# Patient Record
Sex: Female | Born: 1995 | Hispanic: Yes | Marital: Married | Smoking: Never smoker
Health system: Southern US, Community
[De-identification: ages and names within clinical notes are randomized; demographics above are authoritative.]

## PROBLEM LIST (undated history)

## (undated) ENCOUNTER — Inpatient Hospital Stay (HOSPITAL_COMMUNITY): Payer: Self-pay

## (undated) DIAGNOSIS — Z8719 Personal history of other diseases of the digestive system: Secondary | ICD-10-CM

## (undated) DIAGNOSIS — F419 Anxiety disorder, unspecified: Secondary | ICD-10-CM

## (undated) DIAGNOSIS — F32A Depression, unspecified: Secondary | ICD-10-CM

## (undated) DIAGNOSIS — Z789 Other specified health status: Secondary | ICD-10-CM

## (undated) HISTORY — PX: APPENDECTOMY: SHX54

## (undated) HISTORY — PX: CHOLECYSTECTOMY: SHX55

---

## 2020-07-23 LAB — OB RESULTS CONSOLE GC/CHLAMYDIA
Chlamydia: NEGATIVE
Gonorrhea: NEGATIVE

## 2020-07-30 ENCOUNTER — Encounter (HOSPITAL_COMMUNITY): Payer: Self-pay | Admitting: Emergency Medicine

## 2020-07-30 ENCOUNTER — Other Ambulatory Visit: Payer: Self-pay

## 2020-07-30 ENCOUNTER — Inpatient Hospital Stay (HOSPITAL_COMMUNITY): Payer: Medicaid Other

## 2020-07-30 ENCOUNTER — Inpatient Hospital Stay (HOSPITAL_COMMUNITY)
Admission: AD | Admit: 2020-07-30 | Discharge: 2020-07-30 | Disposition: A | Discharge status: Home / Self Care | Payer: Medicaid Other | Attending: Obstetrics and Gynecology | Admitting: Obstetrics and Gynecology

## 2020-07-30 DIAGNOSIS — O99891 Other specified diseases and conditions complicating pregnancy: Secondary | ICD-10-CM

## 2020-07-30 DIAGNOSIS — Z3A08 8 weeks gestation of pregnancy: Secondary | ICD-10-CM | POA: Insufficient documentation

## 2020-07-30 DIAGNOSIS — O99351 Diseases of the nervous system complicating pregnancy, first trimester: Secondary | ICD-10-CM | POA: Diagnosis not present

## 2020-07-30 DIAGNOSIS — O26891 Other specified pregnancy related conditions, first trimester: Secondary | ICD-10-CM | POA: Diagnosis present

## 2020-07-30 DIAGNOSIS — M549 Dorsalgia, unspecified: Secondary | ICD-10-CM | POA: Diagnosis not present

## 2020-07-30 DIAGNOSIS — R519 Headache, unspecified: Secondary | ICD-10-CM

## 2020-07-30 DIAGNOSIS — Z20822 Contact with and (suspected) exposure to covid-19: Secondary | ICD-10-CM | POA: Diagnosis not present

## 2020-07-30 DIAGNOSIS — O219 Vomiting of pregnancy, unspecified: Secondary | ICD-10-CM | POA: Insufficient documentation

## 2020-07-30 HISTORY — DX: Other specified health status: Z78.9

## 2020-07-30 LAB — URINALYSIS, ROUTINE W REFLEX MICROSCOPIC
Bilirubin Urine: NEGATIVE
Glucose, UA: 50 mg/dL — AB
Hgb urine dipstick: NEGATIVE
Ketones, ur: 5 mg/dL — AB
Leukocytes,Ua: NEGATIVE
Nitrite: NEGATIVE
Protein, ur: NEGATIVE mg/dL
Specific Gravity, Urine: 1.032 — ABNORMAL HIGH (ref 1.005–1.030)
pH: 5 (ref 5.0–8.0)

## 2020-07-30 LAB — POCT PREGNANCY, URINE: Preg Test, Ur: POSITIVE — AB

## 2020-07-30 LAB — SARS CORONAVIRUS 2 BY RT PCR (HOSPITAL ORDER, PERFORMED IN ~~LOC~~ HOSPITAL LAB): SARS Coronavirus 2: NEGATIVE

## 2020-07-30 MED ORDER — CYCLOBENZAPRINE HCL 5 MG PO TABS
10.0000 mg | ORAL_TABLET | Freq: Once | ORAL | Status: AC
  Administered 2020-07-30: 10 mg via ORAL
  Filled 2020-07-30: qty 2

## 2020-07-30 MED ORDER — ONDANSETRON 4 MG PO TBDP: 4.0000 mg | ORAL_TABLET | Freq: Four times a day (QID) | ORAL | 0 refills | Status: DC | PRN

## 2020-07-30 MED ORDER — ONDANSETRON 4 MG PO TBDP
4.0000 mg | ORAL_TABLET | Freq: Once | ORAL | Status: AC
  Administered 2020-07-30: 4 mg via ORAL
  Filled 2020-07-30: qty 1

## 2020-07-30 MED ORDER — CYCLOBENZAPRINE HCL 10 MG PO TABS: 10.0000 mg | ORAL_TABLET | Freq: Two times a day (BID) | ORAL | 0 refills | Status: DC | PRN

## 2020-07-30 NOTE — MAU Note (Signed)
Pt reports to MAU c/o lower back pain. Pt reports she went to the OB today and said the baby was okay but she was conserned for kidney stones. Pt reports she is having some bilateral numbness in her legs that feels like her legs are falling asleep. Pt reports HA, Vomiting, SOB.

## 2020-07-30 NOTE — ED Triage Notes (Signed)
Emergency Medicine Provider OB Triage Evaluation Note  Natasha Koch is a 24 y.o. female, G2P1, at [redacted] weeks gestation who presents to the emergency department with complaints of low back pains, nausea vomiting.  Reports low back pain, mostly on the left side for 1 week.  Denies trauma or heavy lifting.  Has had some dysuria.  Went to see her OB/GYN today who confirmed intrauterine pregnancy, noted reassuring UA but wet prep concerning for yeast infection.  Has had chronic urinary incontinence of small amounts of urine ever since her first pregnancy a couple of years ago.  Denies saddle anesthesia or fevers.  Has been taking Tylenol with little relief.  Review of  Systems  Positive: Low back pain, nausea, vomiting, dysuria Negative: Fevers  Physical Exam  BP 127/77   Pulse 94   Temp 99.5 F (37.5 C) (Oral)   Resp 18   LMP 05/30/2020   SpO2 100%  General: Awake, no distress  HEENT: Atraumatic  Resp: Normal effort  Cardiac: Normal rate Abd: Nondistended, nontender  MSK: Moves all extremities without difficulty.  Some midline lumbar spine tenderness with left paralumbar muscle tenderness noted.  5/5 strength of BLE major muscle groups Neuro: Speech clear, sensation intact to light touch of bilateral lower extremities  Medical Decision Making  Pt evaluated for pregnancy concern and is stable for transfer to MAU. Pt is in agreement with plan for transfer.  6:34 PM Discussed with MAU APP, Victorino Dike, who accepts patient in transfer.  Clinical Impression  No diagnosis found.     Jeanie Sewer, PA-C 07/30/20 1836

## 2020-07-30 NOTE — MAU Provider Note (Signed)
Chief Complaint: Back Pain   First Provider Initiated Contact with Patient 07/30/20 1946      SUBJECTIVE HPI: Natasha Koch is a 24 y.o. G2P1001 at [redacted]w[redacted]d by LMP c/w early [place] in the office who presents to maternity admissions reporting back pain, headache, nausea and vomiting x 1 week. She has pain mostly at work while sitting, and the pain radiates down into her legs, and is associated with tingling and numbness in her legs.  She has missed work x 3-4 days this week for the pain.     Location: back, head Quality: dull and pressure pain Severity: 9/10 on pain scale Duration: 1 week Timing: constant, but waxes and wanes Modifying factors: worse after sitting at work Associated signs and symptoms: nausea, vomiting, numbness/tingling in legs  HPI  Past Medical History:  Diagnosis Date  . Medical history non-contributory    Past Surgical History:  Procedure Laterality Date  . APPENDECTOMY     Social History   Socioeconomic History  . Marital status: Married    Spouse name: Not on file  . Number of children: Not on file  . Years of education: Not on file  . Highest education level: Not on file  Occupational History  . Not on file  Tobacco Use  . Smoking status: Never Smoker  . Smokeless tobacco: Never Used  Substance and Sexual Activity  . Alcohol use: Not Currently  . Drug use: Never  . Sexual activity: Not on file  Other Topics Concern  . Not on file  Social History Narrative  . Not on file   Social Determinants of Health   Financial Resource Strain:   . Difficulty of Paying Living Expenses: Not on file  Food Insecurity:   . Worried About Programme researcher, broadcasting/film/video in the Last Year: Not on file  . Ran Out of Food in the Last Year: Not on file  Transportation Needs:   . Lack of Transportation (Medical): Not on file  . Lack of Transportation (Non-Medical): Not on file  Physical Activity:   . Days of Exercise per Week: Not on file  . Minutes of Exercise per Session:  Not on file  Stress:   . Feeling of Stress : Not on file  Social Connections:   . Frequency of Communication with Friends and Family: Not on file  . Frequency of Social Gatherings with Friends and Family: Not on file  . Attends Religious Services: Not on file  . Active Member of Clubs or Organizations: Not on file  . Attends Banker Meetings: Not on file  . Marital Status: Not on file  Intimate Partner Violence:   . Fear of Current or Ex-Partner: Not on file  . Emotionally Abused: Not on file  . Physically Abused: Not on file  . Sexually Abused: Not on file   No current facility-administered medications on file prior to encounter.   Current Outpatient Medications on File Prior to Encounter  Medication Sig Dispense Refill  . Prenatal Vit-Fe Fumarate-FA (MULTIVITAMIN-PRENATAL) 27-0.8 MG TABS tablet Take 1 tablet by mouth daily at 12 noon.     No Known Allergies  ROS:  Review of Systems  Constitutional: Negative for chills, fatigue and fever.  Eyes: Negative for visual disturbance.  Respiratory: Negative for shortness of breath.   Cardiovascular: Negative for chest pain.  Gastrointestinal: Positive for nausea and vomiting. Negative for abdominal pain.  Genitourinary: Negative for difficulty urinating, dysuria, flank pain, pelvic pain, vaginal bleeding, vaginal discharge and  vaginal pain.  Musculoskeletal: Positive for back pain.  Neurological: Positive for numbness and headaches. Negative for dizziness.  Psychiatric/Behavioral: Negative.      I have reviewed patient's Past Medical Hx, Surgical Hx, Family Hx, Social Hx, medications and allergies.   Physical Exam   Patient Vitals for the past 24 hrs:  BP Temp Temp src Pulse Resp SpO2  07/30/20 1819 127/77 99.5 F (37.5 C) Oral 94 18 100 %   Constitutional: Well-developed, well-nourished female in no acute distress.  HEART: normal rate, heart sounds, regular rhythm RESP: normal effort, lung sounds clear and  equal bilaterally GI: Abd soft, non-tender. Pos BS x 4 MS: Extremities nontender, no edema, normal ROM Neurologic: Alert and oriented x 4.  GU: Neg CVAT but generalized tenderness of the low back    LAB RESULTS Results for orders placed or performed during the hospital encounter of 07/30/20 (from the past 24 hour(s))  Urinalysis, Routine w reflex microscopic Urine, Clean Catch     Status: Abnormal   Collection Time: 07/30/20  7:14 PM  Result Value Ref Range   Color, Urine YELLOW YELLOW   APPearance HAZY (A) CLEAR   Specific Gravity, Urine 1.032 (H) 1.005 - 1.030   pH 5.0 5.0 - 8.0   Glucose, UA 50 (A) NEGATIVE mg/dL   Hgb urine dipstick NEGATIVE NEGATIVE   Bilirubin Urine NEGATIVE NEGATIVE   Ketones, ur 5 (A) NEGATIVE mg/dL   Protein, ur NEGATIVE NEGATIVE mg/dL   Nitrite NEGATIVE NEGATIVE   Leukocytes,Ua NEGATIVE NEGATIVE  Pregnancy, urine POC     Status: Abnormal   Collection Time: 07/30/20  7:48 PM  Result Value Ref Range   Preg Test, Ur POSITIVE (A) NEGATIVE       IMAGING US Renal  Result Date: 07/30/2020 CLINICAL DATA:  Back pain and pregnancy. Pregnant patient in first-trimester pregnancy, [redacted] weeks gestation. EXAM: RENAL / URINARY TRACT ULTRASOUND COMPLETE COMPARISON:  None. FINDINGS: Right Kidney: Renal measurements: 11.4 x 5.1 x 4.8 cm = volume: 144 mL. Echogenicity within normal limits. No mass or hydronephrosis visualized. No evidence of echogenic shadowing calculi. Left Kidney: Renal measurements: 11.5 x 4.6 x 4.1 cm = volume: 113 mL. Echogenicity within normal limits. No mass or hydronephrosis visualized. No evidence of echogenic shadowing calculi. Bladder: Appears normal for degree of bladder distention. Other: Increased hepatic echogenicity typical of steatosis. IMPRESSION: 1. Unremarkable sonographic appearance of the kidneys and bladder. 2. Hepatic steatosis. Electronically Signed   By: Narda Rutherford M.D.   On: 07/30/2020 21:21    MAU Management/MDM: Orders  Placed This Encounter  Procedures  . SARS Coronavirus 2 by RT PCR (hospital order, performed in [facility] hospital lab) Nasopharyngeal Nasopharyngeal Swab  . US Renal  . Urinalysis, Routine w reflex microscopic Urine, Clean Catch  . Pregnancy, urine POC  . Discharge patient    Meds ordered this encounter  Medications  . ondansetron (ZOFRAN-ODT) disintegrating tablet 4 mg  . cyclobenzaprine (FLEXERIL) tablet 10 mg  . cyclobenzaprine (FLEXERIL) 10 MG tablet    Sig: Take 1 tablet (10 mg total) by mouth 2 (two) times daily as needed for muscle spasms.    Dispense:  20 tablet    Refill:  0    Order Specific Question:   Supervising Provider    Answer:   Reva Bores [[id number]]  . ondansetron (ZOFRAN ODT) 4 MG disintegrating tablet    Sig: Take 1 tablet (4 mg total) by mouth every 6 (six) hours as needed for nausea.  Dispense:  20 tablet    Refill:  0    Order Specific Question:   Supervising Provider    Answer:   Reva Bores [[id number]]    Pt with low back pain, no CVA tenderness, no hematuria but significant back pain and concerns from pt about kidney stones so renal US done.  No abnormalities on [place].  Unlikely stone with normal [place] and no hematuria.  No evidence of UTI.  Flexeril and Zofran given in MAU. Rx sent for both medications.  Letter provided for pt to miss work this week. F/U in office next week. Consider physical therapy for likely musculoskeletal pain/sciatica.   Pt discharged with strict return precautions.  ASSESSMENT 1. Back pain affecting pregnancy in first trimester   2. Headache in pregnancy, antepartum, first trimester   3. Nausea and vomiting during pregnancy prior to [redacted] weeks gestation   4. Person under investigation for COVID-19     PLAN Discharge home Allergies as of 07/30/2020   No Known Allergies     Medication List    TAKE these medications   cyclobenzaprine 10 MG tablet Commonly known as: FLEXERIL Take 1 tablet (10 mg total) by mouth 2 (two) times daily  as needed for muscle spasms.   multivitamin-prenatal 27-0.8 MG Tabs tablet Take 1 tablet by mouth daily at 12 noon.   ondansetron 4 MG disintegrating tablet Commonly known as: Zofran ODT Take 1 tablet (4 mg total) by mouth every 6 (six) hours as needed for nausea.       Follow-up Information    Ob/Gyn, Nestor Ramp Follow up in 1 week(s).   Contact information: [address] [place] [address] [place] [address] [phone]               Sharen Counter Certified Nurse-Midwife 07/30/2020  9:38 PM

## 2020-07-30 NOTE — ED Notes (Signed)
Transport called, report given to MAU

## 2020-07-30 NOTE — Discharge Instructions (Signed)
To look for COVID vaccine administration in your area:  KeywordPortfolios.com.br  Pregnant and recently pregnant people are more likely to get severely ill with COVID-19 compared with non-pregnant people.  If you are pregnant, you can receive a COVID-19 vaccine.  Please go to the CDC's website to read more about it and make your decision about vaccination: TVMyth.nl.html

## 2020-07-30 NOTE — ED Triage Notes (Signed)
Pt c/o back pain x 1 week, +n/v, pt seen by OB today 8 weeks preg, given meds for yeast infection otherwise neg u/a, denies vag bleeding. Pt sent to ED for further workup

## 2020-07-30 NOTE — MAU Note (Signed)
Sent up from ER for further eval.  Early preg.  Low back pain.  No bleeding. Recent dx of yeast infection.  C/o vomiting.

## 2020-08-06 LAB — OB RESULTS CONSOLE RUBELLA ANTIBODY, IGM: Rubella: IMMUNE

## 2020-08-06 LAB — OB RESULTS CONSOLE HEPATITIS B SURFACE ANTIGEN: Hepatitis B Surface Ag: NEGATIVE

## 2020-08-06 LAB — OB RESULTS CONSOLE HIV ANTIBODY (ROUTINE TESTING): HIV: NONREACTIVE

## 2020-08-24 ENCOUNTER — Ambulatory Visit: Payer: Medicaid Other | Attending: Family Medicine | Admitting: Physical Therapy

## 2020-08-31 ENCOUNTER — Other Ambulatory Visit: Payer: Self-pay | Admitting: Obstetrics and Gynecology

## 2020-09-01 ENCOUNTER — Ambulatory Visit (HOSPITAL_COMMUNITY)
Admission: RE | Admit: 2020-09-01 | Discharge: 2020-09-01 | Disposition: A | Discharge status: Home / Self Care | Payer: Medicaid Other | Source: Ambulatory Visit | Attending: Pulmonary Disease | Admitting: Pulmonary Disease

## 2020-09-01 ENCOUNTER — Other Ambulatory Visit: Payer: Self-pay | Admitting: Family

## 2020-09-01 ENCOUNTER — Other Ambulatory Visit (HOSPITAL_COMMUNITY): Payer: Self-pay | Admitting: Oncology

## 2020-09-01 DIAGNOSIS — U071 COVID-19: Secondary | ICD-10-CM | POA: Insufficient documentation

## 2020-09-01 MED ORDER — SODIUM CHLORIDE 0.9 % IV SOLN
Freq: Once | INTRAVENOUS | Status: AC
  Filled 2020-09-01: qty 20

## 2020-09-01 MED ORDER — DIPHENHYDRAMINE HCL 50 MG/ML IJ SOLN: 50.0000 mg | Freq: Once | INTRAMUSCULAR | Status: DC | PRN

## 2020-09-01 MED ORDER — SODIUM CHLORIDE 0.9 % IV BOLUS
1000.0000 mL | Freq: Once | INTRAVENOUS | Status: AC
  Administered 2020-09-01: 1000 mL via INTRAVENOUS

## 2020-09-01 MED ORDER — FAMOTIDINE IN NACL 20-0.9 MG/50ML-% IV SOLN: 20.0000 mg | Freq: Once | INTRAVENOUS | Status: DC | PRN

## 2020-09-01 MED ORDER — SODIUM CHLORIDE 0.9 % IV SOLN: INTRAVENOUS | Status: DC | PRN

## 2020-09-01 MED ORDER — METHYLPREDNISOLONE SODIUM SUCC 125 MG IJ SOLR: 125.0000 mg | Freq: Once | INTRAMUSCULAR | Status: DC | PRN

## 2020-09-01 MED ORDER — EPINEPHRINE 0.3 MG/0.3ML IJ SOAJ: 0.3000 mg | Freq: Once | INTRAMUSCULAR | Status: DC | PRN

## 2020-09-01 MED ORDER — ALBUTEROL SULFATE HFA 108 (90 BASE) MCG/ACT IN AERS: 2.0000 | INHALATION_SPRAY | Freq: Once | RESPIRATORY_TRACT | Status: DC | PRN

## 2020-09-01 NOTE — Progress Notes (Signed)
  Diagnosis: COVID-19  Physician: Dr. Wright  Procedure: [facility] Infusion [facility] Med: bamlanivimab\etesevimab infusion - Provided patient with bamlanimivab\etesevimab fact sheet for patients, parents and caregivers prior to infusion.  Complications: No immediate complications noted.  Discharge: Discharged home   Natasha Koch J Breccan Galant 09/01/2020  

## 2020-09-01 NOTE — Progress Notes (Signed)
I connected by phone with  Natasha Koch to discuss the potential use of an new treatment for mild to moderate COVID-19 viral infection in non-hospitalized patients.   This patient is a age/sex that meets the FDA criteria for Emergency Use Authorization of casirivimab\imdevimab.  Has a (+) direct SARS-CoV-2 viral test result 1. Has mild or moderate COVID-19  2. Is ? 22 years of age and weighs ? 40 kg 3. Is NOT hospitalized due to COVID-19 4. Is NOT requiring oxygen therapy or requiring an increase in baseline oxygen flow rate due to COVID-19 5. Is within 10 days of symptom onset 6. Has at least one of the high risk factor(s) for progression to severe COVID-19 and/or hospitalization as defined in EUA. Specific high risk criteria : Past Medical History:  Diagnosis Date  . Medical history non-contributory   ?  ? Pregnancy   Symptom onset  08/25/2020   I have spoken and communicated the following to the patient or parent/caregiver:   1. FDA has authorized the emergency use of casirivimab\imdevimab for the treatment of mild to moderate COVID-19 in adults and pediatric patients with positive results of direct SARS-CoV-2 viral testing who are 36 years of age and older weighing at least 40 kg, and who are at high risk for progressing to severe COVID-19 and/or hospitalization.   2. The significant known and potential risks and benefits of casirivimab\imdevimab, and the extent to which such potential risks and benefits are unknown.   3. Information on available alternative treatments and the risks and benefits of those alternatives, including clinical trials.   4. Patients treated with casirivimab\imdevimab should continue to self-isolate and use infection control measures (e.g., wear mask, isolate, social distance, avoid sharing personal items, clean and disinfect "high touch" surfaces, and frequent handwashing) according to CDC guidelines.    5. The patient or parent/caregiver has the option to  accept or refuse casirivimab\imdevimab .   After reviewing this information with the patient, The patient agreed to proceed with receiving casirivimab\imdevimab infusion and will be provided a copy of the Fact sheet prior to receiving the infusion.Mignon Pine, AGNP-C [phone] (Infusion Center Hotline)

## 2020-09-01 NOTE — Discharge Instructions (Signed)

## 2020-10-19 ENCOUNTER — Other Ambulatory Visit: Payer: Self-pay

## 2020-10-19 ENCOUNTER — Telehealth: Payer: Self-pay | Admitting: Physical Therapy

## 2020-10-19 ENCOUNTER — Encounter: Payer: Self-pay | Admitting: Physical Therapy

## 2020-10-19 ENCOUNTER — Ambulatory Visit: Payer: Medicaid Other | Admitting: Physical Therapy

## 2020-10-19 ENCOUNTER — Ambulatory Visit: Payer: Medicaid Other | Attending: Family Medicine

## 2020-10-19 DIAGNOSIS — M545 Low back pain, unspecified: Secondary | ICD-10-CM

## 2020-10-19 NOTE — Therapy (Signed)
[facility] Outpatient Rehabilitation [facility] [address] [place], [place], [address] Phone: [phone]   Fax:  [phone]  Physical Therapy Evaluation  Patient Details  Name: Natasha Koch MRN: [id number] Date of Birth: 03/22/1996 No data recorded  Encounter Date: 10/19/2020   PT End of Session - 10/19/20 1750    Visit Number 1    PT Start Time 1715    PT Stop Time 1730    PT Time Calculation (min) 15 min           Past Medical History:  Diagnosis Date  . Medical history non-contributory     Past Surgical History:  Procedure Laterality Date  . APPENDECTOMY      There were no vitals filed for this visit.    Subjective Assessment - 10/19/20 1720    Subjective Pt. is a 24 y/o female referred to physical therapy for LBP associated with pregnancy-currently [redacted] weeks pregnant (referral date 08/04/20). In screening questions for evaluation pt. reported she has recently been having issues with urinary incontinence for the past several weeks including multiple episodes x2-3 per week of spontaneous incontinence and symptoms for this have been worsening. Concern for cauda equina given these symptoms so patient was recommended to go to ED from clinic for emergent assessment. Pending testing results at ED and further status would plan to rescedule PT eval if results negative and cleared to proceed with therapy.    Limitations --    How long can you sit comfortably? --    How long can you stand comfortably? --                          Objective measurements completed on examination: See above findings.               PT Education - 10/19/20 1749    Education Details Go to ED from clinic for assessment of urinary incontinence symptoms accompanying LBP    Person(s) Educated Patient    Methods Explanation    Comprehension Verbalized understanding                        Patient will benefit from skilled therapeutic  intervention in order to improve the following deficits and impairments:     Visit Diagnosis: Low back pain, unspecified back pain laterality, unspecified chronicity, unspecified whether sciatica present     Problem List There are no problems to display for this patient.   Lazarus Gowda, PT, DPT 10/19/20 5:59 PM  [facility] [address] [place], [place], [address] Phone: [phone]   Fax:  [phone]  Name: Natasha Koch MRN: [id number] Date of Birth: July 26, 1996

## 2020-10-19 NOTE — Therapy (Signed)
[facility] Outpatient Rehabilitation [facility] [address] [place], [place], [address] Phone: [phone]   Fax:  [phone]  Physical Therapy Evaluation  Patient Details  Name: Natasha Koch MRN: [id number] Date of Birth: May 06, 1996 No data recorded  Encounter Date: 10/19/2020   PT End of Session - 10/19/20 1750    Visit Number 1           Past Medical History:  Diagnosis Date  . Medical history non-contributory     Past Surgical History:  Procedure Laterality Date  . APPENDECTOMY      There were no vitals filed for this visit.    Subjective Assessment - 10/19/20 1720    Subjective Pt. is a 24 y/o female referred to physical therapy for LBP associated with pregnancy-currently [redacted] weeks pregnant (referral date 08/04/20). In screening questions for evaluation pt. reported she has recently been having issues with urinary incontinence for the past several weeks including multiple episodes x2-3 per week of spontaneous incontinence and symptoms for this have been worsening. Concern for cauda equina given these symptoms so patient was recommended to go to ED from clinic for emergent assessment. Pending testing results at ED and further status would plan to rescedule PT eval if results negative and cleared to proceed with therapy.    Limitations --    How long can you sit comfortably? --    How long can you stand comfortably? --                          Objective measurements completed on examination: See above findings.               PT Education - 10/19/20 1749    Education Details Go to ED from clinic for assessment of urinary incontinence symptoms accompanying LBP    Person(s) Educated Patient    Methods Explanation    Comprehension Verbalized understanding                        Patient will benefit from skilled therapeutic intervention in order to improve the following deficits and impairments:     Visit  Diagnosis: Low back pain, unspecified back pain laterality, unspecified chronicity, unspecified whether sciatica present     Problem List There are no problems to display for this patient.   Lazarus Gowda, PT, DPT 10/19/20 5:57 PM  [facility] Outpatient Rehabilitation [facility] [address] [place], [place], [address] Phone: [phone]   Fax:  [phone]  Name: CATHARINA PICA MRN: [id number] Date of Birth: 1996/07/16

## 2020-10-19 NOTE — Telephone Encounter (Signed)
Attempted to call Ellender Hose, NP at [facility] to inform of patient status from visit today with patient recommended to go to ED for symptoms concerning for cauda equina. Provider not present at location at the time.

## 2020-10-21 ENCOUNTER — Telehealth: Payer: Self-pay | Admitting: Physical Therapy

## 2020-10-21 NOTE — Telephone Encounter (Signed)
Called and spoke with patient after attempted therapy eval (called patient 10/19/20/day of therapy visit) to see if she had gone to ED as recommended regarding bladder incontinence symptoms. She reported she had not yet gone as she had to pick up her son. Advised again to go to ED regarding symptoms for further assessment.

## 2020-10-21 NOTE — Telephone Encounter (Signed)
Attempted to call Ellender Hose, NP to inform regarding symptoms reported by patient at therapy evaluation visit 10/19/20. Left message to return call.

## 2020-10-26 ENCOUNTER — Telehealth: Payer: Self-pay | Admitting: Physical Therapy

## 2020-10-26 NOTE — Telephone Encounter (Signed)
Called [facility] to contact Ellender Hose, NP regarding status at attempted PT eval 10/19/20. Unable to speak with provider so left message to return call.

## 2020-11-20 NOTE — L&D Delivery Note (Signed)
Delivery Note At 6:24 AM a viable and healthy female was delivered via Vaginal, Spontaneous (Presentation:  Right occiput anterior    ).  APGAR: 8, 9; weight pending .   Placenta status: spontaneous , intact  .  Cord: 3 vessels   Patient pushed for approximately 22 minutes without an epidural and delivered a vigorous female infant in the vertex ROA presentation over an intact perineum with Apgar scores of 8 at 1 minute and 9 at 5 minutes.  Following delivery the infant was passed to the maternal abdomen.  After 1 minute delay the cord was clamped and cut.  The placenta delivered spontaneously, intact, with three-vessel cord.  Bilateral labial tears were noted, however they were hemostatic and did not require repair.  EBL approximately 77 cc.  All sponge, needle, instrument counts were correct.  Mom and baby are both doing well after delivery.  Anesthesia: None Episiotomy:  None Lacerations:  Bilateral labial tears, hemostatic, not requiring repair Suture Repair: NA Est. Blood Loss (mL):  77cc  Mom to postpartum.  Baby to Couplet care / Skin to Skin.  Waynard Reeds 03/09/2021, 6:35 AM

## 2021-02-11 LAB — OB RESULTS CONSOLE GBS: GBS: POSITIVE

## 2021-03-08 ENCOUNTER — Encounter (HOSPITAL_COMMUNITY): Payer: Self-pay | Admitting: Obstetrics and Gynecology

## 2021-03-08 ENCOUNTER — Inpatient Hospital Stay (HOSPITAL_COMMUNITY)
Admission: AD | Admit: 2021-03-08 | Discharge: 2021-03-10 | DRG: 807 | Disposition: A | Discharge status: Home / Self Care | Payer: Medicaid Other | Attending: Obstetrics and Gynecology | Admitting: Obstetrics and Gynecology

## 2021-03-08 ENCOUNTER — Other Ambulatory Visit: Payer: Self-pay

## 2021-03-08 DIAGNOSIS — Z8616 Personal history of COVID-19: Secondary | ICD-10-CM

## 2021-03-08 DIAGNOSIS — Z3A40 40 weeks gestation of pregnancy: Secondary | ICD-10-CM | POA: Diagnosis not present

## 2021-03-08 DIAGNOSIS — O26893 Other specified pregnancy related conditions, third trimester: Secondary | ICD-10-CM | POA: Diagnosis present

## 2021-03-08 DIAGNOSIS — O99824 Streptococcus B carrier state complicating childbirth: Secondary | ICD-10-CM | POA: Diagnosis present

## 2021-03-08 DIAGNOSIS — Z20822 Contact with and (suspected) exposure to covid-19: Secondary | ICD-10-CM | POA: Diagnosis present

## 2021-03-08 LAB — CBC
HCT: 36.3 % (ref 36.0–46.0)
Hemoglobin: 12.3 g/dL (ref 12.0–15.0)
MCH: 28.5 pg (ref 26.0–34.0)
MCHC: 33.9 g/dL (ref 30.0–36.0)
MCV: 84.2 fL (ref 80.0–100.0)
Platelets: 285 10*3/uL (ref 150–400)
RBC: 4.31 MIL/uL (ref 3.87–5.11)
RDW: 14.6 % (ref 11.5–15.5)
WBC: 14 10*3/uL — ABNORMAL HIGH (ref 4.0–10.5)
nRBC: 0 % (ref 0.0–0.2)

## 2021-03-08 LAB — TYPE AND SCREEN
ABO/RH(D): B POS
Antibody Screen: NEGATIVE

## 2021-03-08 MED ORDER — FENTANYL CITRATE (PF) 100 MCG/2ML IJ SOLN
50.0000 ug | INTRAMUSCULAR | Status: DC | PRN
  Administered 2021-03-08: 50 ug via INTRAVENOUS
  Administered 2021-03-09 (×5): 100 ug via INTRAVENOUS
  Filled 2021-03-08 (×6): qty 2

## 2021-03-08 MED ORDER — ONDANSETRON HCL 4 MG/2ML IJ SOLN: 4.0000 mg | Freq: Four times a day (QID) | INTRAMUSCULAR | Status: DC | PRN

## 2021-03-08 MED ORDER — OXYTOCIN-SODIUM CHLORIDE 30-0.9 UT/500ML-% IV SOLN
2.5000 [IU]/h | INTRAVENOUS | Status: DC
  Filled 2021-03-08: qty 500

## 2021-03-08 MED ORDER — OXYTOCIN BOLUS FROM INFUSION
333.0000 mL | Freq: Once | INTRAVENOUS | Status: AC
  Administered 2021-03-09: 333 mL via INTRAVENOUS

## 2021-03-08 MED ORDER — ACETAMINOPHEN 325 MG PO TABS: 650.0000 mg | ORAL_TABLET | ORAL | Status: DC | PRN

## 2021-03-08 MED ORDER — OXYCODONE-ACETAMINOPHEN 5-325 MG PO TABS: 2.0000 | ORAL_TABLET | ORAL | Status: DC | PRN

## 2021-03-08 MED ORDER — LACTATED RINGERS IV SOLN: 500.0000 mL | INTRAVENOUS | Status: DC | PRN

## 2021-03-08 MED ORDER — LIDOCAINE HCL (PF) 1 % IJ SOLN: 30.0000 mL | INTRAMUSCULAR | Status: DC | PRN

## 2021-03-08 MED ORDER — LACTATED RINGERS IV SOLN: INTRAVENOUS | Status: DC

## 2021-03-08 MED ORDER — PENICILLIN G POT IN DEXTROSE 60000 UNIT/ML IV SOLN
3.0000 10*6.[IU] | INTRAVENOUS | Status: DC
  Administered 2021-03-09: 3 10*6.[IU] via INTRAVENOUS
  Filled 2021-03-08: qty 50

## 2021-03-08 MED ORDER — SOD CITRATE-CITRIC ACID 500-334 MG/5ML PO SOLN: 30.0000 mL | ORAL | Status: DC | PRN

## 2021-03-08 MED ORDER — OXYCODONE-ACETAMINOPHEN 5-325 MG PO TABS: 1.0000 | ORAL_TABLET | ORAL | Status: DC | PRN

## 2021-03-08 MED ORDER — SODIUM CHLORIDE 0.9 % IV SOLN
5.0000 10*6.[IU] | Freq: Once | INTRAVENOUS | Status: AC
  Administered 2021-03-09: 5 10*6.[IU] via INTRAVENOUS
  Filled 2021-03-08: qty 5

## 2021-03-08 MED ORDER — FLEET ENEMA 7-19 GM/118ML RE ENEM: 1.0000 | ENEMA | RECTAL | Status: DC | PRN

## 2021-03-08 NOTE — MAU Note (Signed)
PT SAYS HAS AN APPOINTMENT ON Thursday - TO DISCUSS  INDUCTION, UC STRONG SINCE 8PM.  PNC WITH  [facility] - VE 2-3 CM ON LAST WED.  DENIES HSV  GBS- POSITIVE

## 2021-03-09 ENCOUNTER — Encounter (HOSPITAL_COMMUNITY): Payer: Self-pay | Admitting: Obstetrics and Gynecology

## 2021-03-09 LAB — RESP PANEL BY RT-PCR (FLU A&B, COVID) ARPGX2
Influenza A by PCR: NEGATIVE
Influenza B by PCR: NEGATIVE
SARS Coronavirus 2 by RT PCR: NEGATIVE

## 2021-03-09 LAB — RPR: RPR Ser Ql: NONREACTIVE

## 2021-03-09 MED ORDER — PRENATAL MULTIVITAMIN CH
1.0000 | ORAL_TABLET | Freq: Every day | ORAL | Status: DC
  Administered 2021-03-09 – 2021-03-10 (×2): 1 via ORAL
  Filled 2021-03-09 (×2): qty 1

## 2021-03-09 MED ORDER — DIPHENHYDRAMINE HCL 50 MG/ML IJ SOLN: 50.0000 mg | Freq: Once | INTRAMUSCULAR | Status: DC | PRN

## 2021-03-09 MED ORDER — TETANUS-DIPHTH-ACELL PERTUSSIS 5-2.5-18.5 LF-MCG/0.5 IM SUSY: 0.5000 mL | PREFILLED_SYRINGE | Freq: Once | INTRAMUSCULAR | Status: DC

## 2021-03-09 MED ORDER — OXYCODONE HCL 5 MG PO TABS
5.0000 mg | ORAL_TABLET | ORAL | Status: DC | PRN
Start: 2021-03-09 — End: 2021-03-10

## 2021-03-09 MED ORDER — FAMOTIDINE IN NACL 20-0.9 MG/50ML-% IV SOLN: 20.0000 mg | Freq: Once | INTRAVENOUS | Status: DC | PRN

## 2021-03-09 MED ORDER — SENNOSIDES-DOCUSATE SODIUM 8.6-50 MG PO TABS
2.0000 | ORAL_TABLET | Freq: Every day | ORAL | Status: DC
  Administered 2021-03-10: 2 via ORAL
  Filled 2021-03-09: qty 2

## 2021-03-09 MED ORDER — BENZOCAINE-MENTHOL 20-0.5 % EX AERO
1.0000 "application " | INHALATION_SPRAY | CUTANEOUS | Status: DC | PRN
  Administered 2021-03-09: 1 via TOPICAL
  Filled 2021-03-09: qty 56

## 2021-03-09 MED ORDER — SODIUM CHLORIDE 0.9 % IV SOLN: INTRAVENOUS | Status: DC | PRN

## 2021-03-09 MED ORDER — COCONUT OIL OIL: 1.0000 "application " | TOPICAL_OIL | Status: DC | PRN

## 2021-03-09 MED ORDER — METHYLERGONOVINE MALEATE 0.2 MG PO TABS: 0.2000 mg | ORAL_TABLET | ORAL | Status: DC | PRN

## 2021-03-09 MED ORDER — IBUPROFEN 600 MG PO TABS
600.0000 mg | ORAL_TABLET | Freq: Four times a day (QID) | ORAL | Status: DC
  Administered 2021-03-09 – 2021-03-10 (×4): 600 mg via ORAL
  Filled 2021-03-09 (×4): qty 1

## 2021-03-09 MED ORDER — DIBUCAINE (PERIANAL) 1 % EX OINT: 1.0000 "application " | TOPICAL_OINTMENT | CUTANEOUS | Status: DC | PRN

## 2021-03-09 MED ORDER — ACETAMINOPHEN 325 MG PO TABS: 650.0000 mg | ORAL_TABLET | ORAL | Status: DC | PRN

## 2021-03-09 MED ORDER — MENTHOL 3 MG MT LOZG
1.0000 | LOZENGE | OROMUCOSAL | Status: DC | PRN
  Administered 2021-03-09: 3 mg via ORAL
  Filled 2021-03-09: qty 9

## 2021-03-09 MED ORDER — METHYLERGONOVINE MALEATE 0.2 MG/ML IJ SOLN: 0.2000 mg | INTRAMUSCULAR | Status: DC | PRN

## 2021-03-09 MED ORDER — OXYCODONE HCL 5 MG PO TABS: 10.0000 mg | ORAL_TABLET | ORAL | Status: DC | PRN

## 2021-03-09 MED ORDER — WITCH HAZEL-GLYCERIN EX PADS: 1.0000 "application " | MEDICATED_PAD | CUTANEOUS | Status: DC | PRN

## 2021-03-09 MED ORDER — METHYLPREDNISOLONE SODIUM SUCC 125 MG IJ SOLR: 125.0000 mg | Freq: Once | INTRAMUSCULAR | Status: DC | PRN

## 2021-03-09 MED ORDER — ALBUTEROL SULFATE HFA 108 (90 BASE) MCG/ACT IN AERS
2.0000 | INHALATION_SPRAY | Freq: Once | RESPIRATORY_TRACT | Status: DC | PRN
Start: 2021-03-09 — End: 2021-03-09

## 2021-03-09 MED ORDER — SIMETHICONE 80 MG PO CHEW: 80.0000 mg | CHEWABLE_TABLET | ORAL | Status: DC | PRN

## 2021-03-09 MED ORDER — LACTATED RINGERS IV SOLN: INTRAVENOUS | Status: DC

## 2021-03-09 MED ORDER — ONDANSETRON HCL 4 MG/2ML IJ SOLN: 4.0000 mg | INTRAMUSCULAR | Status: DC | PRN

## 2021-03-09 MED ORDER — ONDANSETRON HCL 4 MG PO TABS: 4.0000 mg | ORAL_TABLET | ORAL | Status: DC | PRN

## 2021-03-09 MED ORDER — ZOLPIDEM TARTRATE 5 MG PO TABS: 5.0000 mg | ORAL_TABLET | Freq: Every evening | ORAL | Status: DC | PRN

## 2021-03-09 MED ORDER — DIPHENHYDRAMINE HCL 25 MG PO CAPS: 25.0000 mg | ORAL_CAPSULE | Freq: Four times a day (QID) | ORAL | Status: DC | PRN

## 2021-03-09 MED ORDER — EPINEPHRINE 0.3 MG/0.3ML IJ SOAJ: 0.3000 mg | Freq: Once | INTRAMUSCULAR | Status: DC | PRN

## 2021-03-09 MED ORDER — SODIUM CHLORIDE 0.9 % IV SOLN: Freq: Once | INTRAVENOUS | Status: DC

## 2021-03-09 NOTE — Progress Notes (Signed)
Post Partum Day 0 Subjective: Patient just transferred from L&D She is doing well without complaints. Not yet ambulating. Lochia appropriate  Objective: Patient Vitals for the past 24 hrs:  BP Temp Temp src Pulse Resp SpO2 Height Weight  03/09/21 0905 (!) 114/59 98.1 F (36.7 C) Oral 86 20 -- -- --  03/09/21 0815 -- -- -- -- -- 98 % -- --  03/09/21 0805 118/73 97.9 F (36.6 C) Oral -- 20 -- -- --  03/09/21 0732 (!) 118/58 -- -- 80 18 -- -- --  03/09/21 0716 (!) 117/59 -- -- 85 16 -- -- --  03/09/21 0700 117/70 98.3 F (36.8 C) -- 85 17 -- -- --  03/09/21 0645 (!) 110/45 -- -- 95 16 -- -- --  03/09/21 0630 (!) 114/58 -- -- (!) 105 17 -- -- --  03/09/21 0532 134/73 -- -- 86 -- -- -- --  03/09/21 0312 131/77 97.6 F (36.4 C) -- (!) 102 17 -- -- --  03/09/21 0203 132/81 -- -- 89 16 -- -- --  03/09/21 0100 127/81 -- -- 80 17 -- -- --  03/08/21 2329 130/77 98.6 F (37 C) Oral 84 17 -- 4\' 11"  (1.499 m) 82.6 kg  03/08/21 2215 122/78 98.2 F (36.8 C) Oral 90 20 -- 4\' 11"  (1.499 m) 82.6 kg    Physical Exam:  General: alert, cooperative and no distress Lochia: appropriate Uterine Fundus: firm DVT Evaluation: No evidence of DVT seen on physical exam.  Recent Labs    03/08/21 2254  WBC 14.0*  HGB 12.3  HCT 36.3  PLT 285    No results for input(s): NA, K, CL, CO2CT, BUN, CREATININE, GLUCOSE, BILITOT, ALT, AST, ALKPHOS, PROT, ALBUMIN in the last 72 hours.  No results for input(s): CALCIUM, MG, PHOS in the last 72 hours.  No results for input(s): PROTIME, APTT, INR in the last 72 hours.  No results for input(s): PROTIME, APTT, INR, FIBRINOGEN in the last 72 hours. Assessment/Plan: NZINGA FERRAN 24 y.o. 03/10/21 PPD#0 sp SVD 1. PPC: continue routine postpartum care 2. Rh pos, rubella immune. S/p tdap given prenatally 3. Dispo: likely d/c home tomorrow   LOS: 1 day   Abigail Miyamoto 03/09/2021, 10:18 AM

## 2021-03-09 NOTE — Lactation Note (Signed)
This note was copied from a baby's chart. Lactation Consultation Note  Patient Name: Natasha Koch Today's Date: 03/09/2021 Reason for consult: L&D Initial assessment Age:23 hours   P2, Baby cueing upon entering.  Assisted with latching on R breast with ease. Intermittent swallows observed.  Lactation to follow up on MBU.  Maternal Data Does the patient have breastfeeding experience prior to this delivery?: Yes How long did the patient breastfeed?: 1 month  Feeding Mother's Current Feeding Choice: Breast Milk  LATCH Score Latch: Grasps breast easily, tongue down, lips flanged, rhythmical sucking.  Audible Swallowing: A few with stimulation  Type of Nipple: Everted at rest and after stimulation  Comfort (Breast/Nipple): Soft / non-tender  Hold (Positioning): Assistance needed to correctly position infant at breast and maintain latch.  LATCH Score: 8   Lactation Tools Discussed/Used    Interventions Interventions: Assisted with latch;Skin to skin;Education  Discharge    Consult Status Consult Status: Follow-up Date: 03/10/21 Follow-up type: In-patient    Dahlia Byes [facility] 03/09/2021, 7:11 AM

## 2021-03-09 NOTE — H&P (Signed)
Natasha Koch is a 25 y.o. female presenting for labor  25 year old G2 P1-0-0-1 at 40+2 weeks presents for painful contractions and was found to be in labor.  Her pregnancy was complicated by a false positive RPR at her 1 hour sugar test.  Her titer was 1-1 and her TPA was negative.  She also had a COVID-19 infection for which she received monoclonal antibodies. OB History    Gravida  2   Para  1   Term  1   Preterm      AB      Living  1     SAB      IAB      Ectopic      Multiple      Live Births  1          Past Medical History:  Diagnosis Date  . Medical history non-contributory    Past Surgical History:  Procedure Laterality Date  . CHOLECYSTECTOMY     Family History: family history includes Diabetes in her father, maternal grandfather, maternal grandmother, paternal grandfather, and paternal grandmother. Social History:  reports that she has never smoked. She has never used smokeless tobacco. She reports previous alcohol use. She reports that she does not use drugs.     Maternal Diabetes: No Genetic Screening: Normal Maternal Ultrasounds/Referrals: Normal Fetal Ultrasounds or other Referrals:  None Maternal Substance Abuse:  No Significant Maternal Medications:  None Significant Maternal Lab Results:  Group B Strep positive  Other Comments:  28 weeks RPR positive, titer 1: 1, tPA negative  Review of Systems History Dilation: 7.5 Effacement (%): 90 Station: 0 Exam by:: Carlyn Reichert RN Blood pressure 131/77, pulse (!) 102, temperature 97.6 F (36.4 C), resp. rate 17, height 4\' 11"  (1.499 m), weight 82.6 kg, last menstrual period 05/30/2020. Exam Physical Exam  Prenatal labs: ABO, Rh: --/--/B POS (04/19 2254) Antibody: NEG (04/19 2254) Rubella: Immune (09/17 0000) RPR:   28 weeks RPR positive, titer 1: 1, tPA negative HBsAg: Negative (09/17 0000)  HIV: Non-reactive (09/17 0000)  GBS: Positive/-- (03/25 0000)   Results for orders placed or  performed during the hospital encounter of 03/08/21 (from the past 24 hour(s))  Resp Panel by RT-PCR (Flu A&B, Covid) Nasopharyngeal Swab     Status: None   Collection Time: 03/08/21 10:54 PM   Specimen: Nasopharyngeal Swab; Nasopharyngeal(NP) swabs in vial transport medium  Result Value Ref Range   SARS Coronavirus 2 by RT PCR NEGATIVE NEGATIVE   Influenza A by PCR NEGATIVE NEGATIVE   Influenza B by PCR NEGATIVE NEGATIVE  CBC     Status: Abnormal   Collection Time: 03/08/21 10:54 PM  Result Value Ref Range   WBC 14.0 (H) 4.0 - 10.5 K/uL   RBC 4.31 3.87 - 5.11 MIL/uL   Hemoglobin 12.3 12.0 - 15.0 g/dL   HCT 03/10/21 62.8 - 36.6 %   MCV 84.2 80.0 - 100.0 fL   MCH 28.5 26.0 - 34.0 pg   MCHC 33.9 30.0 - 36.0 g/dL   RDW 29.4 76.5 - 46.5 %   Platelets 285 150 - 400 K/uL   nRBC 0.0 0.0 - 0.2 %  Type and screen [facility]     Status: None   Collection Time: 03/08/21 10:54 PM  Result Value Ref Range   ABO/RH(D) B POS    Antibody Screen NEG    Sample Expiration      03/11/2021,2359 Performed at [facility], [address] [place], [place] [address]      Assessment/Plan: 1) admit 2) penicillin for group B strep 3) epidural on request   Waynard Reeds 03/09/2021, 4:01 AM

## 2021-03-10 LAB — CBC
HCT: 31.9 % — ABNORMAL LOW (ref 36.0–46.0)
Hemoglobin: 10.8 g/dL — ABNORMAL LOW (ref 12.0–15.0)
MCH: 28.7 pg (ref 26.0–34.0)
MCHC: 33.9 g/dL (ref 30.0–36.0)
MCV: 84.8 fL (ref 80.0–100.0)
Platelets: 259 10*3/uL (ref 150–400)
RBC: 3.76 MIL/uL — ABNORMAL LOW (ref 3.87–5.11)
RDW: 15 % (ref 11.5–15.5)
WBC: 11.4 10*3/uL — ABNORMAL HIGH (ref 4.0–10.5)
nRBC: 0 % (ref 0.0–0.2)

## 2021-03-10 LAB — BIRTH TISSUE RECOVERY COLLECTION (PLACENTA DONATION)

## 2021-03-10 MED ORDER — IBUPROFEN 600 MG PO TABS: 600.0000 mg | ORAL_TABLET | Freq: Four times a day (QID) | ORAL | 0 refills | Status: DC | PRN

## 2021-03-10 NOTE — Discharge Summary (Signed)
Postpartum Discharge Summary        Patient Name: Natasha Koch DOB: 08/31/1996 MRN: [id number]  Date of admission: 03/08/2021 Delivery date:03/09/2021  Delivering provider: Waynard Reeds  Date of discharge: 03/10/2021  Admitting diagnosis: Normal labor [O80, Z37.9] Spontaneous vaginal delivery [O80] Intrauterine pregnancy: [redacted]w[redacted]d     Secondary diagnosis:  Active Problems:   Normal labor   Spontaneous vaginal delivery      Discharge diagnosis: Term Pregnancy Delivered                                              Post partum procedures:NA Augmentation: N/A Complications: None  Hospital course: Onset of Labor With Vaginal Delivery      25 y.o. yo [id number] at [redacted]w[redacted]d was admitted in Active Labor on 03/08/2021. Patient had an uncomplicated labor course as follows:  Membrane Rupture Time/Date: 4:00 AM ,03/09/2021   Delivery Method:Vaginal, Spontaneous  Episiotomy: None  Lacerations:  None  Patient had an uncomplicated postpartum course.  She is ambulating, tolerating a regular diet, passing flatus, and urinating well. Patient is discharged home in stable condition on 03/10/21.  Newborn Data: Birth date:03/09/2021  Birth time:6:24 AM  Gender:Female  Living status:Living  Apgars:8 ,9  Weight:2965 g    Physical exam  Vitals:   03/09/21 1259 03/09/21 1704 03/09/21 2100 03/10/21 0535  BP: 114/61 113/69 112/62 104/62  Pulse: 96 86 90 93  Resp: 20 18 16 18   Temp: 98.1 F (36.7 C) 98.1 F (36.7 C) 98 F (36.7 C) 98 F (36.7 C)  TempSrc: Oral Oral Oral Oral  SpO2:  99% 99%   Weight:      Height:       General: alert, cooperative and no distress Lochia: appropriate Uterine Fundus: firm Incision: Healing well with no significant drainage DVT Evaluation: No evidence of DVT seen on physical exam. Labs: Lab Results  Component Value Date   WBC 11.4 (H) 03/10/2021   HGB 10.8 (L) 03/10/2021   HCT 31.9 (L) 03/10/2021   MCV 84.8 03/10/2021   PLT 259 03/10/2021   No  flowsheet data found. Edinburgh Score: Edinburgh Postnatal Depression Scale Screening Tool 03/09/2021  I have been able to laugh and see the funny side of things. 0  I have looked forward with enjoyment to things. 0  I have blamed myself unnecessarily when things went wrong. 1  I have been anxious or worried for no good reason. 1  I have felt scared or panicky for no good reason. 0  Things have been getting on top of me. 1  I have been so unhappy that I have had difficulty sleeping. 0  I have felt sad or miserable. 1  I have been so unhappy that I have been crying. 0  The thought of harming myself has occurred to me. 0  Edinburgh Postnatal Depression Scale Total 4      After visit meds:  Allergies as of 03/10/2021   No Known Allergies     Medication List    STOP taking these medications   cyclobenzaprine 10 MG tablet Commonly known as: FLEXERIL   ondansetron 4 MG disintegrating tablet Commonly known as: Zofran ODT   promethazine 12.5 MG tablet Commonly known as: PHENERGAN     TAKE these medications   ibuprofen 600 MG tablet Commonly known as: ADVIL Take 1 tablet (600 mg  total) by mouth every 6 (six) hours as needed.   multivitamin-prenatal 27-0.8 MG Tabs tablet Take 1 tablet by mouth daily at 12 noon.        Discharge home in stable condition Infant Feeding: Breast Infant Disposition:home with mother Discharge instruction: per After Visit Summary and Postpartum booklet. Activity: Advance as tolerated. Pelvic rest for 6 weeks.  Diet: routine diet Anticipated Birth Control: Unsure Postpartum Appointment:4 weeks Future Appointments:No future appointments. Follow up Visit:  Follow-up Information    Waynard Reeds, MD Follow up in 4 week(s).   Specialty: Obstetrics and Gynecology Why: For a postpartum evaluation Contact information: [address] [place] [address] [phone]                   03/10/2021 Waynard Reeds, MD

## 2021-08-11 IMAGING — US US RENAL
1 series · 15 of 25 positions shown · non-contrast
Comparison: None.

CLINICAL DATA: Back pain and pregnancy. Pregnant patient in
first-trimester pregnancy, 8 weeks gestation.

EXAM:
RENAL / URINARY TRACT ULTRASOUND COMPLETE

[Series 1: us renal · 29 acquisitions, 15 frames shown]
[im 1/29]
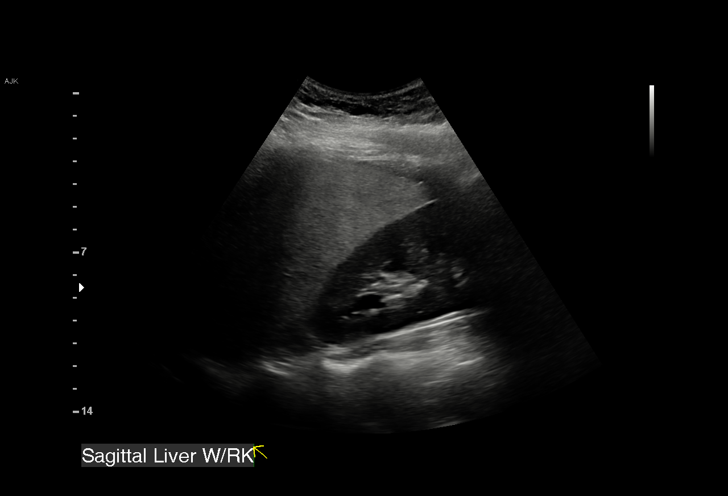
[im 3/29]
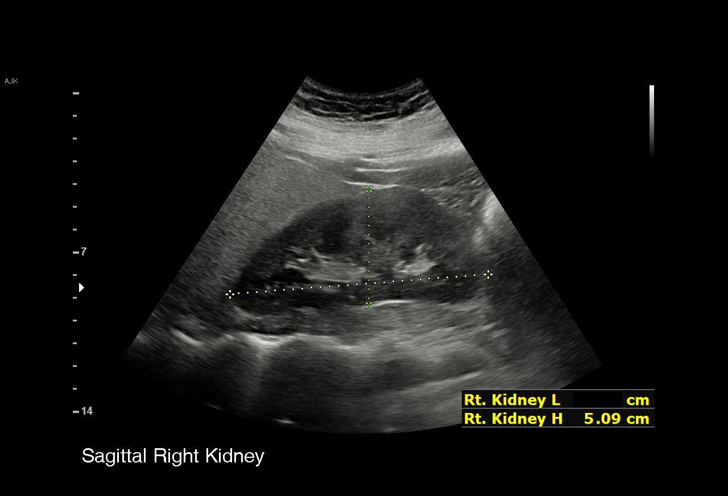
[im 5/29]
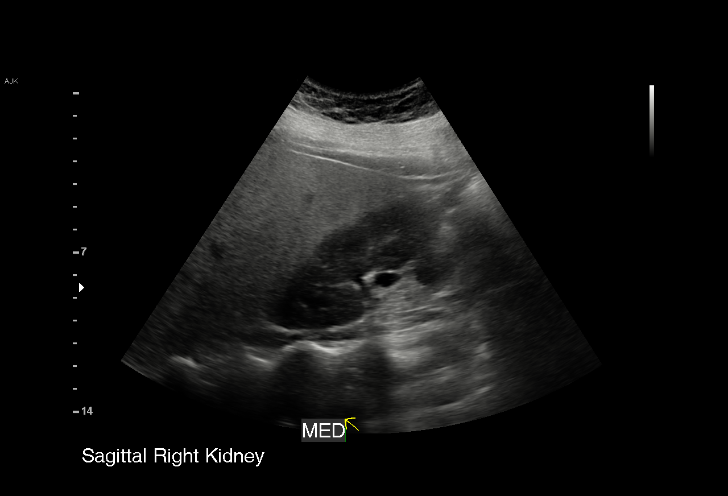
[im 6/29]
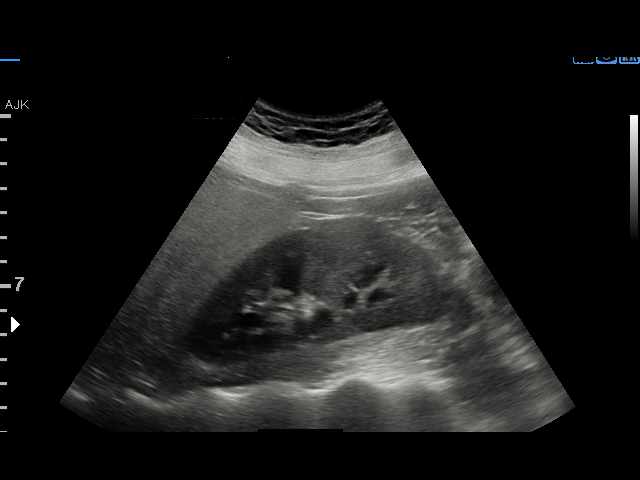
[im 9/29]
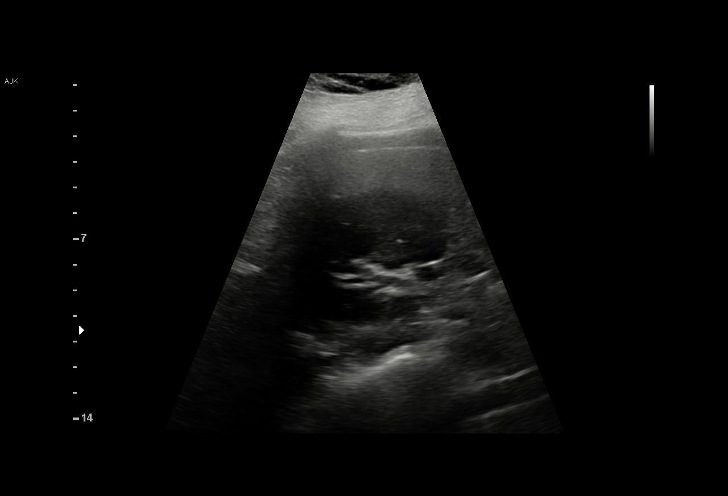
[im 11/29]
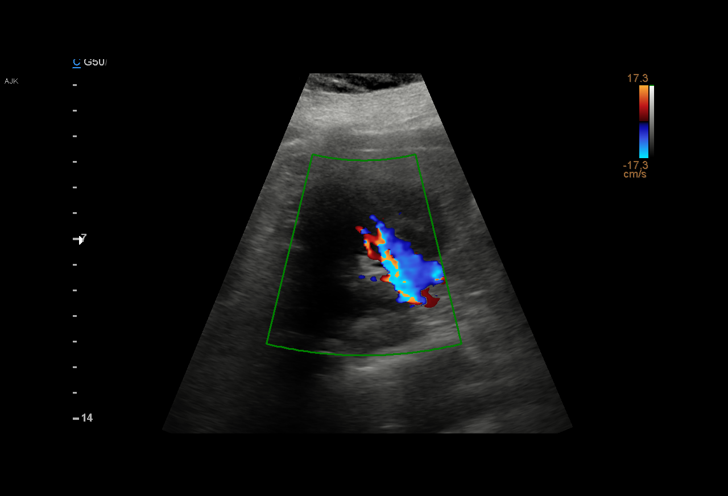
[im 12/29]
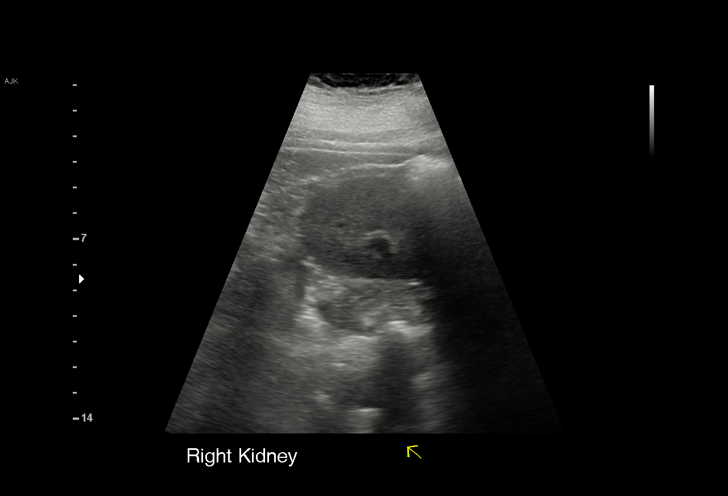
[im 15/29]
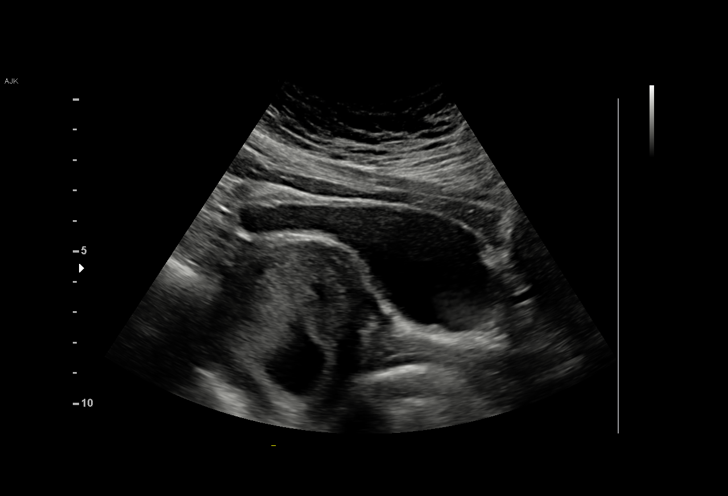
[im 17/29]
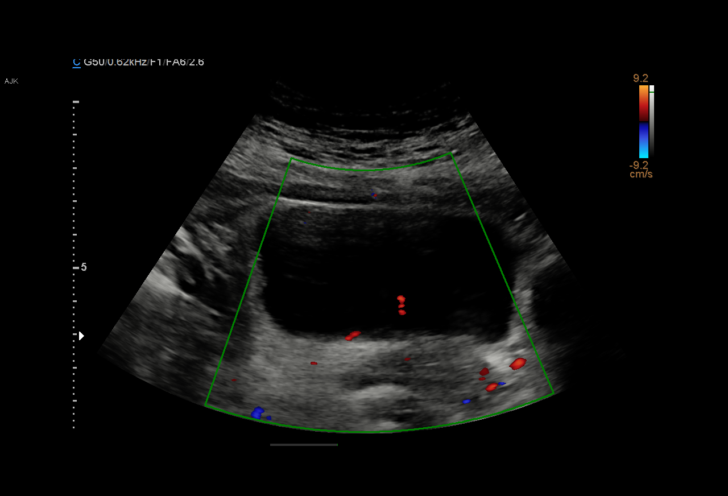
[im 18/29]
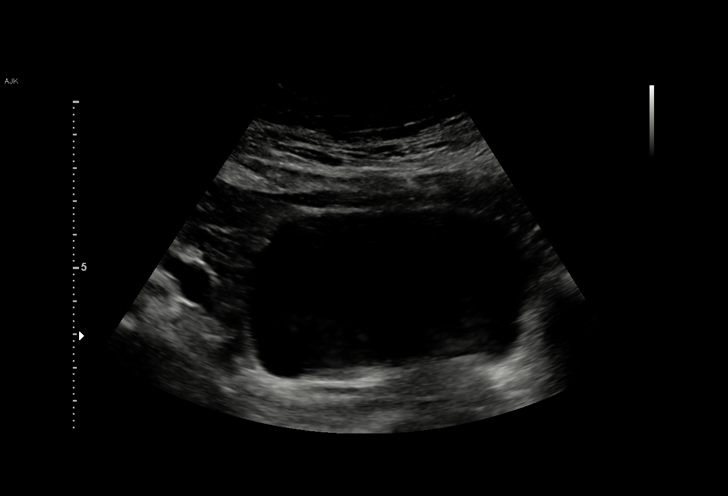
[im 20/29]
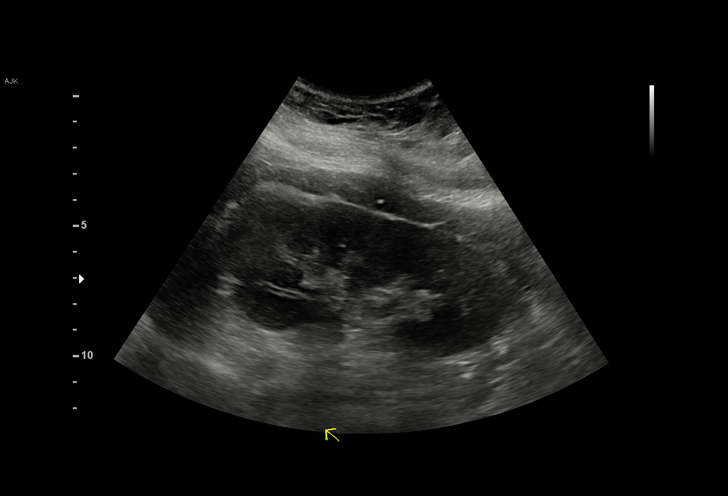
[im 23/29]
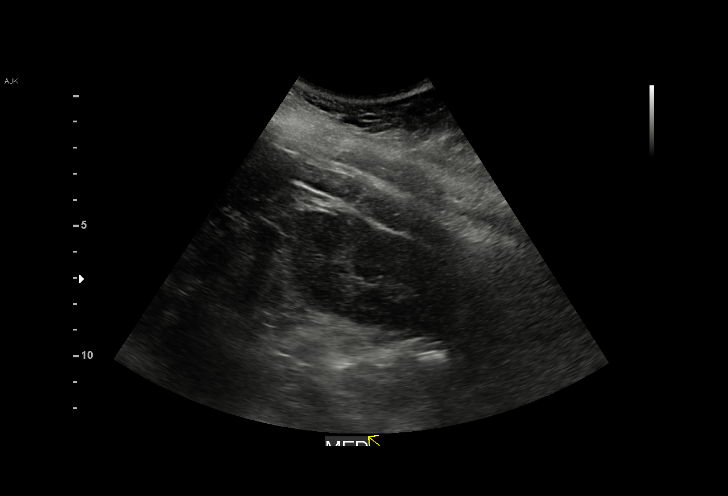
[im 24/29]
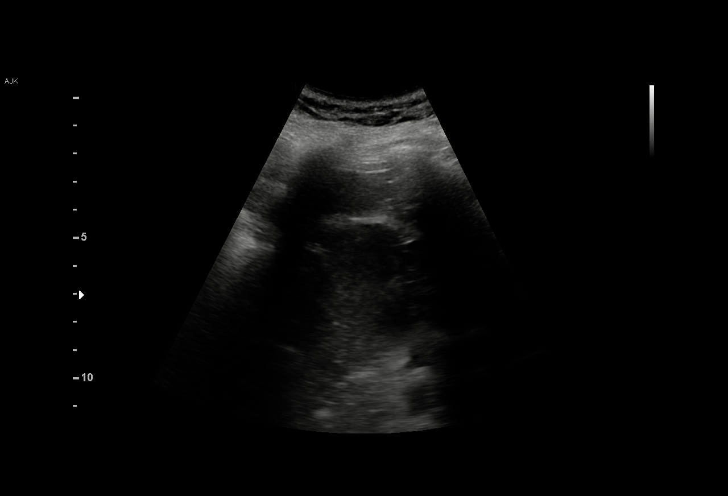
[im 26/29]
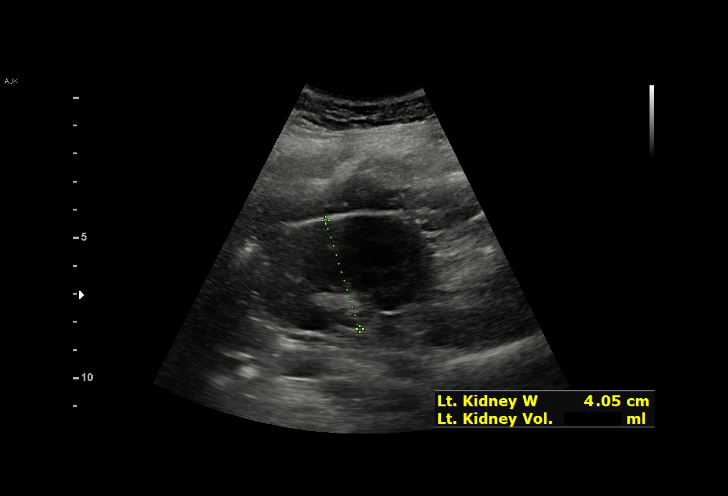
[im 29/29]
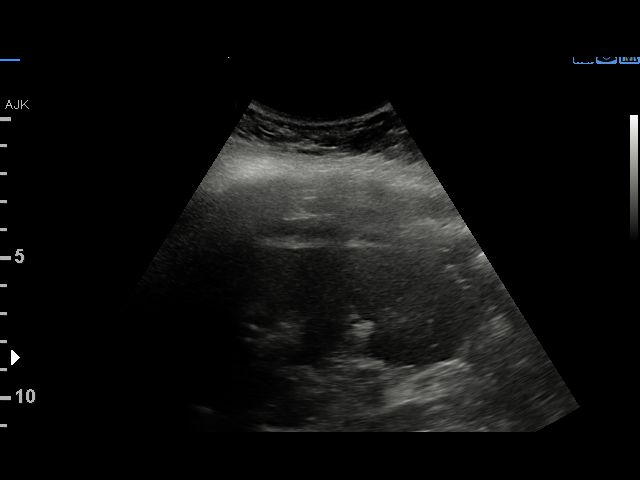

[15 of 25 positions shown; findings below may reference images not displayed]

FINDINGS: Right Kidney:

Renal measurements: 11.4 x 5.1 x 4.8 cm = volume: 144 mL.
Echogenicity within normal limits. No mass or hydronephrosis
visualized. No evidence of echogenic shadowing calculi.

Left Kidney:

Renal measurements: 11.5 x 4.6 x 4.1 cm = volume: 113 mL.
Echogenicity within normal limits. No mass or hydronephrosis
visualized. No evidence of echogenic shadowing calculi.

Bladder:

Appears normal for degree of bladder distention.

Other:

Increased hepatic echogenicity typical of steatosis.
IMPRESSION: 1. Unremarkable sonographic appearance of the kidneys and bladder.
2. Hepatic steatosis.

## 2022-04-03 ENCOUNTER — Other Ambulatory Visit: Payer: Self-pay

## 2022-04-03 ENCOUNTER — Emergency Department (HOSPITAL_COMMUNITY)
Admission: EM | Admit: 2022-04-03 | Discharge: 2022-04-04 | Disposition: A | Discharge status: Home / Self Care | Payer: Medicaid Other | Attending: Emergency Medicine | Admitting: Emergency Medicine

## 2022-04-03 ENCOUNTER — Encounter (HOSPITAL_COMMUNITY): Payer: Self-pay | Admitting: Emergency Medicine

## 2022-04-03 DIAGNOSIS — R3915 Urgency of urination: Secondary | ICD-10-CM | POA: Diagnosis not present

## 2022-04-03 DIAGNOSIS — R1013 Epigastric pain: Secondary | ICD-10-CM | POA: Insufficient documentation

## 2022-04-03 DIAGNOSIS — R112 Nausea with vomiting, unspecified: Secondary | ICD-10-CM | POA: Insufficient documentation

## 2022-04-03 DIAGNOSIS — D72829 Elevated white blood cell count, unspecified: Secondary | ICD-10-CM | POA: Insufficient documentation

## 2022-04-03 DIAGNOSIS — R1012 Left upper quadrant pain: Secondary | ICD-10-CM | POA: Diagnosis not present

## 2022-04-03 DIAGNOSIS — R350 Frequency of micturition: Secondary | ICD-10-CM | POA: Insufficient documentation

## 2022-04-03 DIAGNOSIS — R6883 Chills (without fever): Secondary | ICD-10-CM | POA: Diagnosis not present

## 2022-04-03 LAB — CBC WITH DIFFERENTIAL/PLATELET
Abs Immature Granulocytes: 0.05 10*3/uL (ref 0.00–0.07)
Basophils Absolute: 0 10*3/uL (ref 0.0–0.1)
Basophils Relative: 0 %
Eosinophils Absolute: 0.1 10*3/uL (ref 0.0–0.5)
Eosinophils Relative: 1 %
HCT: 40.8 % (ref 36.0–46.0)
Hemoglobin: 13.8 g/dL (ref 12.0–15.0)
Immature Granulocytes: 0 %
Lymphocytes Relative: 23 %
Lymphs Abs: 3.1 10*3/uL (ref 0.7–4.0)
MCH: 29.2 pg (ref 26.0–34.0)
MCHC: 33.8 g/dL (ref 30.0–36.0)
MCV: 86.3 fL (ref 80.0–100.0)
Monocytes Absolute: 0.8 10*3/uL (ref 0.1–1.0)
Monocytes Relative: 6 %
Neutro Abs: 9.3 10*3/uL — ABNORMAL HIGH (ref 1.7–7.7)
Neutrophils Relative %: 70 %
Platelets: 276 10*3/uL (ref 150–400)
RBC: 4.73 MIL/uL (ref 3.87–5.11)
RDW: 12.3 % (ref 11.5–15.5)
WBC: 13.3 10*3/uL — ABNORMAL HIGH (ref 4.0–10.5)
nRBC: 0 % (ref 0.0–0.2)

## 2022-04-03 LAB — COMPREHENSIVE METABOLIC PANEL
ALT: 38 U/L (ref 0–44)
AST: 22 U/L (ref 15–41)
Albumin: 4 g/dL (ref 3.5–5.0)
Alkaline Phosphatase: 50 U/L (ref 38–126)
Anion gap: 9 (ref 5–15)
BUN: 9 mg/dL (ref 6–20)
CO2: 23 mmol/L (ref 22–32)
Calcium: 9.1 mg/dL (ref 8.9–10.3)
Chloride: 105 mmol/L (ref 98–111)
Creatinine, Ser: 0.54 mg/dL (ref 0.44–1.00)
GFR, Estimated: >60 mL/min (ref >60)
Glucose, Bld: 104 mg/dL — ABNORMAL HIGH (ref 70–99)
Potassium: 3.8 mmol/L (ref 3.5–5.1)
Sodium: 137 mmol/L (ref 135–145)
Total Bilirubin: 0.6 mg/dL (ref 0.3–1.2)
Total Protein: 7.3 g/dL (ref 6.5–8.1)

## 2022-04-03 LAB — URINALYSIS, ROUTINE W REFLEX MICROSCOPIC
Bilirubin Urine: NEGATIVE
Glucose, UA: NEGATIVE mg/dL
Hgb urine dipstick: NEGATIVE
Ketones, ur: 20 mg/dL — AB
Nitrite: NEGATIVE
Protein, ur: 30 mg/dL — AB
Specific Gravity, Urine: 1.029 (ref 1.005–1.030)
pH: 5 (ref 5.0–8.0)

## 2022-04-03 LAB — I-STAT BETA HCG BLOOD, ED (MC, WL, AP ONLY): I-stat hCG, quantitative: <5 m[IU]/mL (ref <5)

## 2022-04-03 LAB — LIPASE, BLOOD: Lipase: 33 U/L (ref 11–51)

## 2022-04-03 NOTE — ED Provider Triage Note (Signed)
Emergency Medicine Provider Triage Evaluation Note ? ?Natasha Koch , a 26 y.o. female  was evaluated in triage.  Pt complains of abdominal pain and back pain.  Sent from urgent care due to concern for Pyelo?  Having dysuria.  Denies any fever ? ?Review of Systems  ?Positive: Abdominal pain, dysuria, back pain ?Negative: Fever ? ?Physical Exam  ?BP 127/88 (BP Location: Left Arm)   Pulse 75   Temp 98.6 ?F (37 ?C) (Oral)   Resp 16   SpO2 100%  ?Gen:   Awake, no distress   ?Resp:  Normal effort  ?MSK:   Moves extremities without difficulty  ?Other:  Abdomen generally tender ? ?Medical Decision Making  ?Medically screening exam initiated at 9:33 PM.  Appropriate orders placed.  Natasha Koch was informed that the remainder of the evaluation will be completed by another provider, this initial triage assessment does not replace that evaluation, and the importance of remaining in the ED until their evaluation is complete. ? ?Labs and urinalysis ordered ?  ?Delia Heady, PA-C ?04/03/22 2134 ? ?

## 2022-04-03 NOTE — ED Triage Notes (Signed)
Pt sent to ED by UC for evaluation of abdominal pain since yesterday. Pt states she is very bloated and has been vomiting. States UC is concerned about renal function.  ?

## 2022-04-04 ENCOUNTER — Encounter (HOSPITAL_COMMUNITY): Payer: Self-pay | Admitting: Student

## 2022-04-04 MED ORDER — SODIUM CHLORIDE 0.9 % IV BOLUS
1000.0000 mL | Freq: Once | INTRAVENOUS | Status: AC
  Administered 2022-04-04: 1000 mL via INTRAVENOUS

## 2022-04-04 MED ORDER — FAMOTIDINE IN NACL 20-0.9 MG/50ML-% IV SOLN
20.0000 mg | Freq: Once | INTRAVENOUS | Status: AC
  Administered 2022-04-04: 20 mg via INTRAVENOUS
  Filled 2022-04-04: qty 50

## 2022-04-04 MED ORDER — ONDANSETRON HCL 4 MG/2ML IJ SOLN
4.0000 mg | Freq: Once | INTRAMUSCULAR | Status: AC
  Administered 2022-04-04: 4 mg via INTRAVENOUS
  Filled 2022-04-04: qty 2

## 2022-04-04 MED ORDER — PANTOPRAZOLE SODIUM 40 MG PO TBEC: 40.0000 mg | DELAYED_RELEASE_TABLET | Freq: Every day | ORAL | 0 refills | Status: DC

## 2022-04-04 MED ORDER — ALUM & MAG HYDROXIDE-SIMETH 200-200-20 MG/5ML PO SUSP
30.0000 mL | Freq: Once | ORAL | Status: AC
  Administered 2022-04-04: 30 mL via ORAL
  Filled 2022-04-04: qty 30

## 2022-04-04 MED ORDER — SUCRALFATE 1 GM/10ML PO SUSP: 1.0000 g | Freq: Three times a day (TID) | ORAL | 0 refills | Status: DC

## 2022-04-04 MED ORDER — ONDANSETRON 4 MG PO TBDP: 4.0000 mg | ORAL_TABLET | Freq: Three times a day (TID) | ORAL | 0 refills | Status: DC | PRN

## 2022-04-04 MED ORDER — LIDOCAINE VISCOUS HCL 2 % MT SOLN
15.0000 mL | Freq: Once | OROMUCOSAL | Status: AC
  Administered 2022-04-04: 15 mL via ORAL
  Filled 2022-04-04: qty 15

## 2022-04-04 MED ORDER — NITROFURANTOIN MONOHYD MACRO 100 MG PO CAPS: 100.0000 mg | ORAL_CAPSULE | Freq: Two times a day (BID) | ORAL | 0 refills | Status: DC

## 2022-04-04 NOTE — Discharge Instructions (Addendum)
You were seen in the emergency department today for abdominal pain with vomiting.  Your white blood cell count was mildly elevated.  Your urine sample showed some findings of infection.  We are covering you for UTI with Macrobid, please take this as prescribed.  Suspect that your pain is primarily related to irritation of your stomach lining.  We are sending you home with the following medications to help with your symptoms:  ?- Protonix- please take 1 tablet in the morning prior to any meals to help with stomach acidity/pain.  ?- Carafate- please take prior to each meal and prior to bedtime to help with stomach acidity/pain.  ?- Zofran- please take every 8 hours as needed for nausea/vomiting.  ? ?We have prescribed you new medication(s) today. Discuss the medications prescribed today with your pharmacist as they can have adverse effects and interactions with your other medicines including over the counter and prescribed medications. Seek medical evaluation if you start to experience new or abnormal symptoms after taking one of these medicines, seek care immediately if you start to experience difficulty breathing, feeling of your throat closing, facial swelling, or rash as these could be indications of a more serious allergic reaction ? ?Please follow attached diet guidelines.  ? ?Follow up with your primary care provider within 3 days for re-evaluation.  We have also provided information for a GI doctor to follow-up with. ?Return to the ER for new or worsening symptoms including but not limited to worsened pain, new pain, inability to keep fluids down, blood in vomit/stool, passing out, or any other concerns.  ? ?

## 2022-04-04 NOTE — ED Provider Notes (Signed)
?[facility] EMERGENCY DEPARTMENT ?Provider Note ? ? ?CSN: [id number] ?[facility]: [facility] ? ?  ? ?History ? ?Chief Complaint  ?Patient presents with  ? Abdominal Pain  ? ? ?Natasha MiyamotoKelly C Karge is a 26 y.o. female with a hx of prior cholecystectomy [facility]-2 days. Patient reports pain to the epigastrium, radiates to the upper abdomen and around the back bilaterally. Intermittent, worse after eating, no other alleviating/aggravating factors. Having associated nausea/vomiting, has had some chills. Denies fever, hematemesis, dysuria, diarrhea, or melena. Reports some urinary frequency/urgency. UC was concerned about her urine being infected with leukocytosis therefore referred to the ED.  ? ?HPI ? ?  ? ?Home Medications ?Prior to Admission medications   ?Medication Sig Start Date End Date Taking? Authorizing Provider  ?ibuprofen (ADVIL) 600 MG tablet Take 1 tablet (600 mg total) by mouth every 6 (six) hours as needed. 03/10/21   Waynard Reedsoss, Kendra, MD  ?Prenatal Vit-Fe Fumarate-FA (MULTIVITAMIN-PRENATAL) 27-0.8 MG TABS tablet Take 1 tablet by mouth daily at 12 noon.    [provider]  ?   ? ?Allergies    ?Patient has no known allergies.   ? ?Review of Systems   ?Review of Systems  ?Constitutional:  Positive for chills. Negative for fever.  ?Respiratory:  Negative for cough and shortness of breath.   ?Cardiovascular:  Negative for chest pain.  ?Gastrointestinal:  Positive for abdominal pain, nausea and vomiting. Negative for blood in stool and diarrhea.  ?Genitourinary:  Positive for frequency and urgency. Negative for dysuria, vaginal bleeding and vaginal discharge.  ?Neurological:  Negative for syncope.  ?All other systems reviewed and are negative. ? ?Physical Exam ?Updated Vital Signs ?BP 122/83   Pulse 83   Temp 98.6 ?F (37 ?C) (Oral)   Resp 16   SpO2 100%  ?Physical Exam ?Vitals and nursing note reviewed.   ?Constitutional:   ?   General: She is not in acute distress. ?   Appearance: She is well-developed. She is not toxic-appearing.  ?HENT:  ?   Head: Normocephalic and atraumatic.  ?Eyes:  ?   General:     ?   Right eye: No discharge.     ?   Left eye: No discharge.  ?   Conjunctiva/sclera: Conjunctivae normal.  ?Cardiovascular:  ?   Rate and Rhythm: Normal rate and regular rhythm.  ?Pulmonary:  ?   Effort: No respiratory distress.  ?   Breath sounds: Normal breath sounds. No wheezing or rales.  ?Abdominal:  ?   General: There is no distension.  ?   Palpations: Abdomen is soft.  ?   Tenderness: There is abdominal tenderness in the epigastric area and left upper quadrant. There is no right CVA tenderness, left CVA tenderness, guarding or rebound.  ?Musculoskeletal:  ?   Cervical back: Neck supple.  ?Skin: ?   General: Skin is warm and dry.  ?Neurological:  ?   Mental Status: She is alert.  ?   Comments: Clear speech.   ?Psychiatric:     ?   Behavior: Behavior normal.  ? ? ?ED Results / Procedures / Treatments   ?Labs ?(all labs ordered are listed, but only abnormal results are displayed) ?Labs Reviewed  ?COMPREHENSIVE METABOLIC PANEL - Abnormal; Notable for the following components:  ?    Result Value  ? Glucose, Bld 104 (*)   ? All other components within normal limits  ?  CBC WITH DIFFERENTIAL/PLATELET - Abnormal; Notable for the following components:  ? WBC 13.3 (*)   ? Neutro Abs 9.3 (*)   ? All other components within normal limits  ?URINALYSIS, ROUTINE W REFLEX MICROSCOPIC - Abnormal; Notable for the following components:  ? APPearance HAZY (*)   ? Ketones, ur 20 (*)   ? Protein, ur 30 (*)   ? Leukocytes,Ua SMALL (*)   ? Bacteria, UA RARE (*)   ? All other components within normal limits  ?URINE CULTURE  ?LIPASE, BLOOD  ?I-STAT BETA HCG BLOOD, ED ([facility], [facility], [facility] ONLY)  ?I-STAT BETA HCG BLOOD, ED ([facility], [facility], [facility] ONLY)  ? ? ?EKG ?None ? ?Radiology ?No results found. ? ?Procedures ?Procedures  ? ? ?Medications Ordered in  ED ?Medications  ?sodium chloride 0.9 % bolus 1,000 mL (has no administration in time range)  ?ondansetron (ZOFRAN) injection 4 mg (has no administration in time range)  ?famotidine (PEPCID) IVPB 20 mg premix (has no administration in time range)  ? ? ?ED Course/ Medical Decision Making/ A&P ?  ?                        ?Medical Decision Making ?Amount and/or Complexity of Data Reviewed ?Labs: ordered. ? ?Risk ?OTC drugs. ?Prescription drug management. ? ?Patient presents to the ED with complaints of abdominal pain, this involves an extensive number of treatment options, and is a complaint that carries with it a high risk of complications and morbidity. Nontoxic, vitals unremarkable. Mild epigastric/LUQ TTP.  ? ?Ddx including but not limited to: GERD, PUD, pancreatitis, perforation, obstruction, pyelonephritis, UTI, ectopic pregnancy, PID ? ?Additional history obtained:  ?Chart & nursing note reviewed.  ?External records reviewed including results from [facility] clinic that patient brought with her in hard copy form.  ? ?Lab Tests:  ?I viewed & interpreted labs including:  ?CBC: mild leukocytosis.  ?CMP: unremarkable ?Lipase: WNL ?Preg test: Negative ?UA: small leuks, rare bacteria, WBCs present, however is contaminated ? ?I ordered fluids, zofran, and protonix for symptomatic management.  ? ?On reassessment patient is resting comfortably, she is tolerating p.o., she states she feels much better.  Repeat abdominal exam remains without peritoneal signs, I have a low suspicion for acute surgical process.  Favor GERD/PUD as primary etiology of symptoms.  Given she has had some urinary symptoms her urine was sent for culture and will cover for UTI-clinically not consistent with pyelonephritis or sepsis. ? ?I discussed results, treatment plan, need for follow-up, and return precautions with the patient. Provided opportunity for questions, patient confirmed understanding and is in agreement with plan.  ? ?Based on patient's chief  complaint, I considered admission might be necessary, however after reassuring ED workup feel patient is reasonable for discharge.  ? ? ?Portions of this note were generated with Scientist, clinical (histocompatibility and immunogenetics). Dictation errors may occur despite best attempts at proofreading. ?Final Clinical Impression(s) / ED Diagnoses ?Final diagnoses:  ?Epigastric pain  ? ? ?Rx / DC Orders ?ED Discharge Orders   ? ?      Ordered  ?  nitrofurantoin, macrocrystal-monohydrate, (MACROBID) 100 MG capsule  2 times daily       ? 04/04/22 0707  ?  pantoprazole (PROTONIX) 40 MG tablet  Daily       ? 04/04/22 0707  ?  ondansetron (ZOFRAN-ODT) 4 MG disintegrating tablet  Every 8 hours PRN       ? 04/04/22 0707  ?  sucralfate (CARAFATE) 1 GM/10ML suspension  3 times daily with meals & bedtime       ? 04/04/22 9211  ? ?  ?  ? ?  ? ? ?  ?Cherly Anderson, PA-C ?04/04/22 9417 ? ?  ?Shon Baton, MD ?04/05/22 [phone] ? ?

## 2022-04-06 LAB — URINE CULTURE

## 2022-10-04 ENCOUNTER — Emergency Department (HOSPITAL_COMMUNITY)
Admission: EM | Admit: 2022-10-04 | Discharge: 2022-10-04 | Disposition: A | Discharge status: Home / Self Care | Payer: Medicaid Other | Attending: Emergency Medicine | Admitting: Emergency Medicine

## 2022-10-04 ENCOUNTER — Other Ambulatory Visit: Payer: Self-pay

## 2022-10-04 ENCOUNTER — Emergency Department (HOSPITAL_COMMUNITY): Payer: Medicaid Other

## 2022-10-04 DIAGNOSIS — R1033 Periumbilical pain: Secondary | ICD-10-CM | POA: Diagnosis not present

## 2022-10-04 DIAGNOSIS — N9489 Other specified conditions associated with female genital organs and menstrual cycle: Secondary | ICD-10-CM | POA: Insufficient documentation

## 2022-10-04 DIAGNOSIS — R112 Nausea with vomiting, unspecified: Secondary | ICD-10-CM | POA: Insufficient documentation

## 2022-10-04 DIAGNOSIS — R1013 Epigastric pain: Secondary | ICD-10-CM | POA: Diagnosis not present

## 2022-10-04 DIAGNOSIS — E860 Dehydration: Secondary | ICD-10-CM | POA: Diagnosis not present

## 2022-10-04 LAB — COMPREHENSIVE METABOLIC PANEL
ALT: 40 U/L (ref 0–44)
AST: 30 U/L (ref 15–41)
Albumin: 4.2 g/dL (ref 3.5–5.0)
Alkaline Phosphatase: 45 U/L (ref 38–126)
Anion gap: 12 (ref 5–15)
BUN: 7 mg/dL (ref 6–20)
CO2: 21 mmol/L — ABNORMAL LOW (ref 22–32)
Calcium: 9.4 mg/dL (ref 8.9–10.3)
Chloride: 106 mmol/L (ref 98–111)
Creatinine, Ser: 0.55 mg/dL (ref 0.44–1.00)
GFR, Estimated: >60 mL/min (ref >60)
Glucose, Bld: 100 mg/dL — ABNORMAL HIGH (ref 70–99)
Potassium: 4 mmol/L (ref 3.5–5.1)
Sodium: 139 mmol/L (ref 135–145)
Total Bilirubin: 1.2 mg/dL (ref 0.3–1.2)
Total Protein: 7.4 g/dL (ref 6.5–8.1)

## 2022-10-04 LAB — LIPASE, BLOOD: Lipase: 34 U/L (ref 11–51)

## 2022-10-04 LAB — URINALYSIS, ROUTINE W REFLEX MICROSCOPIC
Bacteria, UA: NONE SEEN
Bilirubin Urine: NEGATIVE
Glucose, UA: NEGATIVE mg/dL
Hgb urine dipstick: NEGATIVE
Ketones, ur: 80 mg/dL — AB
Nitrite: NEGATIVE
Protein, ur: NEGATIVE mg/dL
Specific Gravity, Urine: 1.012 (ref 1.005–1.030)
pH: 6 (ref 5.0–8.0)

## 2022-10-04 LAB — CBC
HCT: 42.3 % (ref 36.0–46.0)
Hemoglobin: 14.9 g/dL (ref 12.0–15.0)
MCH: 29.9 pg (ref 26.0–34.0)
MCHC: 35.2 g/dL (ref 30.0–36.0)
MCV: 84.8 fL (ref 80.0–100.0)
Platelets: 263 10*3/uL (ref 150–400)
RBC: 4.99 MIL/uL (ref 3.87–5.11)
RDW: 12.7 % (ref 11.5–15.5)
WBC: 10.3 10*3/uL (ref 4.0–10.5)
nRBC: 0 % (ref 0.0–0.2)

## 2022-10-04 LAB — I-STAT BETA HCG BLOOD, ED (MC, WL, AP ONLY): I-stat hCG, quantitative: <5 m[IU]/mL (ref <5)

## 2022-10-04 MED ORDER — MORPHINE SULFATE (PF) 4 MG/ML IV SOLN
4.0000 mg | Freq: Once | INTRAVENOUS | Status: AC
  Administered 2022-10-04: 4 mg via INTRAVENOUS
  Filled 2022-10-04: qty 1

## 2022-10-04 MED ORDER — IOHEXOL 350 MG/ML SOLN
75.0000 mL | Freq: Once | INTRAVENOUS | Status: AC | PRN
  Administered 2022-10-04: 75 mL via INTRAVENOUS

## 2022-10-04 MED ORDER — ONDANSETRON HCL 4 MG/2ML IJ SOLN
4.0000 mg | Freq: Once | INTRAMUSCULAR | Status: AC
  Administered 2022-10-04: 4 mg via INTRAVENOUS
  Filled 2022-10-04: qty 2

## 2022-10-04 MED ORDER — SODIUM CHLORIDE 0.9 % IV BOLUS
1000.0000 mL | Freq: Once | INTRAVENOUS | Status: AC
  Administered 2022-10-04: 1000 mL via INTRAVENOUS

## 2022-10-04 MED ORDER — ONDANSETRON 4 MG PO TBDP
4.0000 mg | ORAL_TABLET | Freq: Once | ORAL | Status: AC | PRN
  Administered 2022-10-04: 4 mg via ORAL
  Filled 2022-10-04: qty 1

## 2022-10-04 MED ORDER — FENTANYL CITRATE PF 50 MCG/ML IJ SOSY
100.0000 ug | PREFILLED_SYRINGE | Freq: Once | INTRAMUSCULAR | Status: AC
  Administered 2022-10-04: 100 ug via INTRAVENOUS
  Filled 2022-10-04: qty 2

## 2022-10-04 MED ORDER — ONDANSETRON HCL 4 MG PO TABS: 4.0000 mg | ORAL_TABLET | Freq: Four times a day (QID) | ORAL | 0 refills | Status: DC | PRN

## 2022-10-04 MED ORDER — DICYCLOMINE HCL 10 MG PO CAPS
10.0000 mg | ORAL_CAPSULE | Freq: Once | ORAL | Status: AC
  Administered 2022-10-04: 10 mg via ORAL
  Filled 2022-10-04: qty 1

## 2022-10-04 MED ORDER — DICYCLOMINE HCL 20 MG PO TABS: 20.0000 mg | ORAL_TABLET | Freq: Two times a day (BID) | ORAL | 0 refills | Status: DC | PRN

## 2022-10-04 NOTE — ED Triage Notes (Signed)
Abdominal pain started 1 week ago, pt. Has an umbilical hernia  She has Tramadol for pain after childbirth and it is not effective.  She is also having n/v Spoke with her PCP today and they recommended to come to the ED for possible surgery.

## 2022-10-04 NOTE — ED Provider Triage Note (Signed)
Emergency Medicine Provider Triage Evaluation Note  Natasha Koch , a 26 y.o. female  was evaluated in triage.  Pt complains of abdominal pain nausea and vomiting.  Has a history of umbilical hernia.  She noticed that it recently got much larger and more tender.  She has had 24 hours of abdominal pain nausea and intractable vomiting.  No previous history of obstruction.  Review of Systems  Positive: Hernia Negative: EVAR  Physical Exam  BP 122/82 (BP Location: Right Arm)   Pulse 80   Temp 99 F (37.2 C) (Oral)   Resp 18   Ht 4\' 11"  (1.499 m)   Wt 77.1 kg   LMP 08/28/2022   SpO2 98%   BMI 34.34 kg/m  Gen:   Awake, no distress   Resp:  Normal effort  MSK:   Moves extremities without difficulty  Other:  Unable to easily assess umbilical hernia due to body habitus  Medical Decision Making  Medically screening exam initiated at 4:01 PM.  Appropriate orders placed.  10/28/2022 was informed that the remainder of the evaluation will be completed by another provider, this initial triage assessment does not replace that evaluation, and the importance of remaining in the ED until their evaluation is complete.  Work-up initiated   Jaryah Aracena Miyamoto, PA-C 10/04/22 10/06/22

## 2022-10-04 NOTE — Discharge Instructions (Signed)
You can use the Bentyl for crampy abdominal pain, nausea medication as needed for ongoing nausea, vomiting.  I would rest, drink plenty of fluids, and follow-up with your primary care doctor as needed or return to the emergency department if your abdominal pain significantly worsens.

## 2022-10-04 NOTE — ED Provider Notes (Signed)
[facility] EMERGENCY DEPARTMENT Provider Note   CSN: [id number] Arrival date & time: 10/04/22  1254     History  Chief Complaint  Patient presents with   Abdominal Pain    Natasha Koch is a 26 y.o. female Presenting with 1 week of peri-umbilical abdominal pain. Not very appreciable on exam. Endorses emesis every 2 hours. Reports 110 fever at home (or some other more reasonable fever). Constipation, last 24 hours, no BM. Pain worsening. Had been taking tramadol, last dose 3 days ago. Hx of prior chole 2017. Vaginal delivery ~April this year. LMP over 30 days ago, normal runs long. Some radiating lumbar midline to paraspinal back pain. Normal strength, sensation, distal pulses.    Abdominal Pain      Home Medications Prior to Admission medications   Medication Sig Start Date End Date Taking? Authorizing Provider  dicyclomine (BENTYL) 20 MG tablet Take 1 tablet (20 mg total) by mouth 2 (two) times daily as needed for spasms. 10/04/22  Yes Ziya Coonrod H, PA-C  ondansetron (ZOFRAN) 4 MG tablet Take 1 tablet (4 mg total) by mouth every 6 (six) hours as needed for nausea or vomiting. 10/04/22  Yes Mohan Erven H, PA-C  acetaminophen (TYLENOL) 500 MG tablet Take 500 mg by mouth every 6 (six) hours as needed for mild pain or headache.    [provider]  ibuprofen (ADVIL) 600 MG tablet Take 1 tablet (600 mg total) by mouth every 6 (six) hours as needed. Patient not taking: Reported on 04/04/2022 03/10/21   Waynard Reeds, MD  nitrofurantoin, macrocrystal-monohydrate, (MACROBID) 100 MG capsule Take 1 capsule (100 mg total) by mouth 2 (two) times daily. 04/04/22   Petrucelli, Samantha R, PA-C  ondansetron (ZOFRAN-ODT) 4 MG disintegrating tablet Take 1 tablet (4 mg total) by mouth every 8 (eight) hours as needed for nausea or vomiting. 04/04/22   Petrucelli, Samantha R, PA-C  pantoprazole (PROTONIX) 40 MG tablet Take 1 tablet (40 mg total) by mouth daily.  04/04/22   Petrucelli, Samantha R, PA-C  sucralfate (CARAFATE) 1 GM/10ML suspension Take 10 mLs (1 g total) by mouth 4 (four) times daily -  with meals and at bedtime. 04/04/22   Petrucelli, Pleas Koch, PA-C      Allergies    Patient has no known allergies.    Review of Systems   Review of Systems  Gastrointestinal:  Positive for abdominal pain.  All other systems reviewed and are negative.   Physical Exam Updated Vital Signs BP 110/82   Pulse 80   Temp 98.2 F (36.8 C)   Resp 16   Ht 4\' 11"  (1.499 m)   Wt 77.1 kg   LMP 08/28/2022   SpO2 100%   BMI 34.34 kg/m  Physical Exam Vitals and nursing note reviewed.  Constitutional:      General: She is not in acute distress.    Appearance: Normal appearance.  HENT:     Head: Normocephalic and atraumatic.  Eyes:     General:        Right eye: No discharge.        Left eye: No discharge.  Cardiovascular:     Rate and Rhythm: Normal rate and regular rhythm.     Heart sounds: No murmur heard.    No friction rub. No gallop.  Pulmonary:     Effort: Pulmonary effort is normal.     Breath sounds: Normal breath sounds.  Abdominal:     General: Bowel sounds are  normal.     Palpations: Abdomen is soft.     Comments: Ttp most focally in epigastric region with focal tenderness just superior to umbilicus, no rebound, rigidity, guarding  Skin:    General: Skin is warm and dry.     Capillary Refill: Capillary refill takes less than 2 seconds.  Neurological:     Mental Status: She is alert and oriented to person, place, and time.  Psychiatric:        Mood and Affect: Mood normal.        Behavior: Behavior normal.     ED Results / Procedures / Treatments   Labs (all labs ordered are listed, but only abnormal results are displayed) Labs Reviewed  URINALYSIS, ROUTINE W REFLEX MICROSCOPIC - Abnormal; Notable for the following components:      Result Value   APPearance HAZY (*)    Ketones, ur 80 (*)    Leukocytes,Ua MODERATE (*)     All other components within normal limits  COMPREHENSIVE METABOLIC PANEL - Abnormal; Notable for the following components:   CO2 21 (*)    Glucose, Bld 100 (*)    All other components within normal limits  CBC  LIPASE, BLOOD  I-STAT BETA HCG BLOOD, ED ([facility], [facility], [facility] ONLY)    EKG None  Radiology CT Abdomen Pelvis W Contrast  Result Date: 10/04/2022 CLINICAL DATA:  Abdominal pain. EXAM: CT ABDOMEN AND PELVIS WITH CONTRAST TECHNIQUE: Multidetector CT imaging of the abdomen and pelvis was performed using the standard protocol following bolus administration of intravenous contrast. RADIATION DOSE REDUCTION: This exam was performed according to the departmental dose-optimization program which includes automated exposure control, adjustment of the mA and/or kV according to patient size and/or use of iterative reconstruction technique. CONTRAST:  43mL OMNIPAQUE IOHEXOL 350 MG/ML SOLN COMPARISON:  None Available. FINDINGS: Lower chest: No acute abnormality. Hepatobiliary: There is diffuse fatty infiltration of the liver parenchyma. No focal liver abnormality is seen. Status post cholecystectomy. No biliary dilatation. Pancreas: Unremarkable. No pancreatic ductal dilatation or surrounding inflammatory changes. Spleen: Normal in size without focal abnormality. Adrenals/Urinary Tract: Adrenal glands are unremarkable. Kidneys are normal, without renal calculi, focal lesion, or hydronephrosis. The urinary bladder is poorly distended and subsequently limited in evaluation. Stomach/Bowel: Stomach is within normal limits. Appendix appears normal. No evidence of bowel wall thickening, distention, or inflammatory changes. Vascular/Lymphatic: No significant vascular findings are present. No enlarged abdominal or pelvic lymph nodes. Reproductive: Uterus and bilateral adnexa are unremarkable. Other: A 4.1 cm x 1.4 cm fat containing left-sided para umbilical hernia is noted. No abdominopelvic ascites. Musculoskeletal: No  acute or significant osseous findings. IMPRESSION: 1. Hepatic steatosis. 2. Evidence of prior cholecystectomy. 3. Fat containing paraumbilical hernia. Electronically Signed   By: Aram Candela M.D.   On: 10/04/2022 23:21    Procedures Procedures    Medications Ordered in ED Medications  ondansetron (ZOFRAN-ODT) disintegrating tablet 4 mg (4 mg Oral Given 10/04/22 1601)  fentaNYL (SUBLIMAZE) injection 100 mcg (100 mcg Intravenous Given 10/04/22 1628)  ondansetron (ZOFRAN) injection 4 mg (4 mg Intravenous Given 10/04/22 2041)  sodium chloride 0.9 % bolus 1,000 mL (0 mLs Intravenous Stopped 10/04/22 2124)  morphine (PF) 4 MG/ML injection 4 mg (4 mg Intravenous Given 10/04/22 2041)  dicyclomine (BENTYL) capsule 10 mg (10 mg Oral Given 10/04/22 2041)  iohexol (OMNIPAQUE) 350 MG/ML injection 75 mL (75 mLs Intravenous Contrast Given 10/04/22 2309)    ED Course/ Medical Decision Making/ A&P  Medical Decision Making Amount and/or Complexity of Data Reviewed Labs: ordered.  Risk Prescription drug management.   This patient is a 26 y.o. female who presents to the ED for concern of epigastric abdominal pain, nausea, vomiting, concern for dehydration, this involves an extensive number of treatment options, and is a complaint that carries with it a high risk of complications and morbidity. The emergent differential diagnosis prior to evaluation includes, but is not limited to, enteritis, acute mesenteric ischemia, ongoing biliary duct pathology despite prior cholecystectomy, pancreatitis, strangulated or incarcerated hernia versus other.   This is not an exhaustive differential.   Past Medical History / Co-morbidities / Social History: Previous cholecystectomy, spontaneous vaginal delivery, no other significant medical problems  Additional history: Chart reviewed. Pertinent results include: Reviewed lab work, imaging from previous emergency department  visits  Physical Exam: Physical exam performed. The pertinent findings include: Patient is focally tender in the epigastric region without rebound, rigidity, guarding, she has a minimally apparent umbilical hernia without any overlying skin changes, or significant focal tenderness, it is not distended at this time, bowel sounds normal throughout  Lab Tests: I ordered, and personally interpreted labs.  The pertinent results include: CBC unremarkable, CMP unremarkable, lipase normal, hCG negative, some moderate leukocytes is seen as well as ketones, not convincing urine for a urinary tract infection, however does point to mild dehydration   Imaging Studies: I ordered imaging studies including CT abdomen pelvis with contrast. I independently visualized and interpreted imaging which showed no acute intra abdominal abnormality, mild hepatic steatosis, fat-containing umbilical hernia without any signs of strangulation, or incarceration. I agree with the radiologist interpretation.   Medications: I ordered medication including morphine, Zofran, fluids for abdominal pain, nausea, vomiting. Reevaluation of the patient after these medicines showed that the patient improved. I have reviewed the patients home medicines and have made adjustments as needed.   Disposition: After consideration of the diagnostic results and the patients response to treatment, I feel that patient appears appropriate for discharge with evidence of some likely gastroenteritis with dehydration.   emergency department workup does not suggest an emergent condition requiring admission or immediate intervention beyond what has been performed at this time. The plan is: dc with bentyl, zofran, PCP follow up as needed. The patient is safe for discharge and has been instructed to return immediately for worsening symptoms, change in symptoms or any other concerns.  I discussed this case with my attending physician Dr. Deretha Emory who cosigned  this note including patient's presenting symptoms, physical exam, and planned diagnostics and interventions. Attending physician stated agreement with plan or made changes to plan which were implemented.    Final Clinical Impression(s) / ED Diagnoses Final diagnoses:  Epigastric pain  Nausea and vomiting, unspecified vomiting type  Dehydration    Rx / DC Orders ED Discharge Orders          Ordered    dicyclomine (BENTYL) 20 MG tablet  2 times daily PRN        10/04/22 2329    ondansetron (ZOFRAN) 4 MG tablet  Every 6 hours PRN        10/04/22 2329              Olene Floss, PA-C 10/04/22 2336    Vanetta Mulders, MD 10/10/22 1538

## 2022-10-16 ENCOUNTER — Inpatient Hospital Stay (HOSPITAL_COMMUNITY)
Admission: AD | Admit: 2022-10-16 | Discharge: 2022-10-19 | DRG: 885 | Disposition: A | Discharge status: Home / Self Care | Payer: Medicaid Other | Source: Other Acute Inpatient Hospital | Attending: Psychiatry | Admitting: Psychiatry

## 2022-10-16 ENCOUNTER — Encounter (HOSPITAL_COMMUNITY): Payer: Self-pay | Admitting: Psychiatry

## 2022-10-16 DIAGNOSIS — F410 Panic disorder [episodic paroxysmal anxiety] without agoraphobia: Secondary | ICD-10-CM | POA: Diagnosis present

## 2022-10-16 DIAGNOSIS — Z9151 Personal history of suicidal behavior: Secondary | ICD-10-CM | POA: Diagnosis not present

## 2022-10-16 DIAGNOSIS — F332 Major depressive disorder, recurrent severe without psychotic features: Secondary | ICD-10-CM | POA: Diagnosis not present

## 2022-10-16 DIAGNOSIS — Z91148 Patient's other noncompliance with medication regimen for other reason: Secondary | ICD-10-CM

## 2022-10-16 DIAGNOSIS — F339 Major depressive disorder, recurrent, unspecified: Secondary | ICD-10-CM | POA: Diagnosis present

## 2022-10-16 DIAGNOSIS — Z79899 Other long term (current) drug therapy: Secondary | ICD-10-CM

## 2022-10-16 DIAGNOSIS — F329 Major depressive disorder, single episode, unspecified: Principal | ICD-10-CM | POA: Diagnosis present

## 2022-10-16 DIAGNOSIS — R45851 Suicidal ideations: Secondary | ICD-10-CM | POA: Diagnosis present

## 2022-10-16 HISTORY — DX: Personal history of other diseases of the digestive system: Z87.19

## 2022-10-16 MED ORDER — ACETAMINOPHEN 325 MG PO TABS: 650.0000 mg | ORAL_TABLET | Freq: Four times a day (QID) | ORAL | Status: DC | PRN

## 2022-10-16 MED ORDER — HYDROXYZINE HCL 25 MG PO TABS
25.0000 mg | ORAL_TABLET | Freq: Three times a day (TID) | ORAL | Status: DC | PRN
  Administered 2022-10-17 (×3): 25 mg via ORAL
  Filled 2022-10-16 (×3): qty 1

## 2022-10-16 MED ORDER — SERTRALINE HCL 50 MG PO TABS
50.0000 mg | ORAL_TABLET | Freq: Every day | ORAL | Status: DC
  Administered 2022-10-17 – 2022-10-19 (×3): 50 mg via ORAL
  Filled 2022-10-16 (×5): qty 1

## 2022-10-16 MED ORDER — ALUM & MAG HYDROXIDE-SIMETH 200-200-20 MG/5ML PO SUSP: 30.0000 mL | ORAL | Status: DC | PRN

## 2022-10-16 MED ORDER — MAGNESIUM HYDROXIDE 400 MG/5ML PO SUSP: 30.0000 mL | Freq: Every day | ORAL | Status: DC | PRN

## 2022-10-16 MED ORDER — TRAZODONE HCL 50 MG PO TABS
50.0000 mg | ORAL_TABLET | Freq: Every evening | ORAL | Status: DC | PRN
  Administered 2022-10-17 – 2022-10-18 (×3): 50 mg via ORAL
  Filled 2022-10-16 (×3): qty 1

## 2022-10-17 ENCOUNTER — Other Ambulatory Visit: Payer: Self-pay

## 2022-10-17 DIAGNOSIS — F410 Panic disorder [episodic paroxysmal anxiety] without agoraphobia: Secondary | ICD-10-CM | POA: Diagnosis present

## 2022-10-17 LAB — URINALYSIS, ROUTINE W REFLEX MICROSCOPIC
Bacteria, UA: NONE SEEN
Bilirubin Urine: NEGATIVE
Glucose, UA: NEGATIVE mg/dL
Hgb urine dipstick: NEGATIVE
Ketones, ur: 5 mg/dL — AB
Leukocytes,Ua: NEGATIVE
Nitrite: NEGATIVE
Protein, ur: NEGATIVE mg/dL
Specific Gravity, Urine: 1.034 — ABNORMAL HIGH (ref 1.005–1.030)
pH: 5 (ref 5.0–8.0)

## 2022-10-17 LAB — TSH: TSH: 4.289 u[IU]/mL (ref 0.350–4.500)

## 2022-10-17 MED ORDER — LORAZEPAM 0.5 MG PO TABS
0.5000 mg | ORAL_TABLET | Freq: Four times a day (QID) | ORAL | Status: DC | PRN
  Administered 2022-10-17: 0.5 mg via ORAL
  Filled 2022-10-17: qty 1

## 2022-10-17 NOTE — Tx Team (Signed)
Initial Treatment Plan 10/17/2022 12:19 AM Natasha Koch MAU:[id number]    PATIENT STRESSORS: Financial difficulties   Marital or family conflict     PATIENT STRENGTHS: Forensic psychologist fund of knowledge  Motivation for treatment/growth    PATIENT IDENTIFIED PROBLEMS: "I had panic attack in the bathroom"  "My husband left me with a lot of bills"  "I have been depressed"                 DISCHARGE CRITERIA:  Ability to meet basic life and health needs Improved stabilization in mood, thinking, and/or behavior Motivation to continue treatment in a less acute level of care Need for constant or close observation no longer present  PRELIMINARY DISCHARGE PLAN: Outpatient therapy Return to previous living arrangement  PATIENT/FAMILY INVOLVEMENT: This treatment plan has been presented to and reviewed with the patient, Natasha Koch.  The patient and family have been given the opportunity to ask questions and make suggestions.  Margarita Rana, RN 10/17/2022, 12:19 AM

## 2022-10-17 NOTE — Progress Notes (Signed)
Patient requested for medication to help with her anxiety, patient states, "I think I'm having a panic attack." When asked what triggered her panic attack , patient responding, "nothing I'm just thinking about something from my head." PO hydroxyzine 25 mg, and ativan 0.5 mg given to patient for panic attack at 1452. Patient is in the day room at this time, well relaxed. Staff will continue to provide support to patient.
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
## 2022-10-17 NOTE — H&P (Signed)
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 improve the patient's condition.    Olga Coaster, MS3   I discussed my assessment, planned testing and intervention for the patient with Dr. Caswell Corwin who agrees with my formulated course of action.  Attestation for Student Documentation:  I personally was present and performed or re-performed the history, physical exam and medical decision-making activities of this service and have verified that the service and findings are accurately documented in the student's note.  Rolanda Lundborg, MD 10/17/2022, 5:29 PM   Rolanda Lundborg, MD, PGY-1 11/28/20235:29 PM

## 2022-10-17 NOTE — [facility] Group Notes (Signed)
The focus of this group is to help patients review their daily goal of treatment and discuss progress on daily workbooks. Pt was attentive and appropriate during tonight's wrap up group. Pt stated that she was able to work on anxiety. Pt stated that she  is currently trying to stay med compliant and continue to manage anxiety.

## 2022-10-17 NOTE — Progress Notes (Signed)
Pt wants to regulate medication to control anxiety.
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
## 2022-10-17 NOTE — Group Note (Signed)
Recreation Therapy Group Note   Group Topic:Animal Assisted Therapy   Group Date: 10/17/2022 Start Time: 1425 End Time: 1500 Facilitators: Keric Zehren-McCall, LRT,CTRS Location: [address]   Animal-Assisted Activity (AAA) Program Checklist/Progress Note Patient Eligibility Criteria Checklist & Daily Group note for Rec Tx Intervention   AAA/T Program Assumption of Risk Form signed by Patient/ or Parent Legal Guardian YES  Patient is free of allergies or severe asthma  YES  Patient reports no fear of animals YES  Patient reports no history of cruelty to animals YES  Patient understands their participation is voluntary YES  Patient washes hands before animal contact YES  Patient washes hands after animal contact YES    Group Description: Patients provided opportunity to interact with trained and credentialed [employer] Therapy dog and the community volunteer/dog handler. Patients practiced appropriate animal interaction and were educated on dog safety outside of the hospital in common community settings. Patients were allowed to use dog toys and other items to practice commands, engage the dog in play, and/or complete routine aspects of animal care.   Education: Charity fundraiser, Health visitor, Communication & Social Skills    Affect/Mood: Appropriate   Participation Level: Engaged   Participation Quality: Independent   Behavior: Appropriate    Clinical Observations/Individualized Feedback:  Patients provided opportunity to interact with trained and credentialed [employer] Therapy dog and the community volunteer/dog handler. Patients practiced appropriate animal interaction and were educated on dog safety outside of the hospital in common community settings. Patients were allowed to use dog toys and other items to practice commands, engage the dog in play, and/or complete routine aspects of animal care.   Plan: Continue to engage patient in RT  group sessions 2-3x/week.   Summerlynn Glauser-McCall, LRT,CTRS 10/17/2022 3:37 PM

## 2022-10-17 NOTE — [facility] Suicide Risk Assessment (Signed)
[facility] INPATIENT:  Family/Significant Other Suicide Prevention Education  Suicide Prevention Education:  Patient Refusal for Family/Significant Other Suicide Prevention Education: The patient Natasha Koch has refused to provide written consent for family/significant other to be provided Family/Significant Other Suicide Prevention Education during admission and/or prior to discharge.  Physician notified.  Niara Bunker S Huy Majid 10/17/2022, 2:11 PM

## 2022-10-18 ENCOUNTER — Encounter (HOSPITAL_COMMUNITY): Payer: Self-pay

## 2022-10-18 LAB — CBC WITH DIFFERENTIAL/PLATELET
Abs Immature Granulocytes: 0.02 10*3/uL (ref 0.00–0.07)
Basophils Absolute: 0.1 10*3/uL (ref 0.0–0.1)
Basophils Relative: 1 %
Eosinophils Absolute: 0.1 10*3/uL (ref 0.0–0.5)
Eosinophils Relative: 2 %
HCT: 43.2 % (ref 36.0–46.0)
Hemoglobin: 14.1 g/dL (ref 12.0–15.0)
Immature Granulocytes: 0 %
Lymphocytes Relative: 33 %
Lymphs Abs: 3 10*3/uL (ref 0.7–4.0)
MCH: 29.3 pg (ref 26.0–34.0)
MCHC: 32.6 g/dL (ref 30.0–36.0)
MCV: 89.6 fL (ref 80.0–100.0)
Monocytes Absolute: 0.6 10*3/uL (ref 0.1–1.0)
Monocytes Relative: 6 %
Neutro Abs: 5.4 10*3/uL (ref 1.7–7.7)
Neutrophils Relative %: 58 %
Platelets: 314 10*3/uL (ref 150–400)
RBC: 4.82 MIL/uL (ref 3.87–5.11)
RDW: 12.3 % (ref 11.5–15.5)
WBC: 9.3 10*3/uL (ref 4.0–10.5)
nRBC: 0 % (ref 0.0–0.2)

## 2022-10-18 LAB — COMPREHENSIVE METABOLIC PANEL
ALT: 26 U/L (ref 0–44)
AST: 19 U/L (ref 15–41)
Albumin: 3.7 g/dL (ref 3.5–5.0)
Alkaline Phosphatase: 47 U/L (ref 38–126)
Anion gap: 7 (ref 5–15)
BUN: 12 mg/dL (ref 6–20)
CO2: 24 mmol/L (ref 22–32)
Calcium: 9.2 mg/dL (ref 8.9–10.3)
Chloride: 107 mmol/L (ref 98–111)
Creatinine, Ser: 0.63 mg/dL (ref 0.44–1.00)
GFR, Estimated: >60 mL/min (ref >60)
Glucose, Bld: 105 mg/dL — ABNORMAL HIGH (ref 70–99)
Potassium: 4.2 mmol/L (ref 3.5–5.1)
Sodium: 138 mmol/L (ref 135–145)
Total Bilirubin: 0.6 mg/dL (ref 0.3–1.2)
Total Protein: 7.2 g/dL (ref 6.5–8.1)

## 2022-10-18 NOTE — Group Note (Signed)
Date:  10/18/2022 Time:  11:22 AM  Group Topic/Focus:  Orientation:   The focus of this group is to educate the patient on the purpose and policies of crisis stabilization and provide a format to answer questions about their admission.  The group details unit policies and expectations of patients while admitted.    Participation Level:  Minimal  Participation Quality:  Attentive  Affect:  Appropriate  Cognitive:  Appropriate  Insight: Lacking  Engagement in Group:  Limited  Modes of Intervention:  Discussion  Additional Comments:     Reymundo Poll 10/18/2022, 11:22 AM

## 2022-10-18 NOTE — Group Note (Signed)
Recreation Therapy Group Note   Group Topic:Team Building  Group Date: 10/18/2022 Start Time: 0932 End Time: 0955 Facilitators: Afnan Cadiente-McCall, LRT,CTRS Location: [address] Day[address]   Goal Area(s) Addresses:  Patient will effectively work with peer towards shared goal.  Patient will identify skills used to make activity successful.  Patient will identify how skills used during activity can be used to reach post d/c goals.   Group Description: Landing Pad. In teams of 3-5, patients were given 12 plastic drinking straws and an equal length of masking tape. Using the materials provided, patients were asked to build a landing pad to catch a golf ball dropped from approximately 5 feet in the air. All materials were required to be used by the team in their design. LRT facilitated post-activity discussion.   Affect/Mood: Appropriate   Participation Level: Minimal   Participation Quality: Independent   Behavior: Appropriate   Speech/Thought Process: Focused   Insight: Good   Judgement: Good   Modes of Intervention: STEM Activity   Patient Response to Interventions:  Attentive   Education Outcome:  Acknowledges education and In group clarification offered    Clinical Observations/Individualized Feedback: Pt was attentive and more observant than engaging in the activity.  Pt was pleasant and social.    Plan: Continue to engage patient in RT group sessions 2-3x/week.   Natasha Koch, LRT,CTRS 10/18/2022 11:58 AM

## 2022-10-18 NOTE — Progress Notes (Signed)
   10/17/22 2030  Psych Admission Type (Psych Patients Only)  Admission Status Voluntary  Psychosocial Assessment  Patient Complaints Anxiety  Eye Contact Fair  Facial Expression Anxious  Affect Anxious  Speech Logical/coherent  Interaction Assertive  Motor Activity Other (Comment) (WDL)  Appearance/Hygiene Unremarkable  Behavior Characteristics Anxious;Cooperative  Mood Anxious  Thought Process  Coherency WDL  Content Blaming others  Delusions None reported or observed  Perception WDL  Hallucination None reported or observed  Judgment Poor  Confusion None  Danger to Self  Current suicidal ideation? Denies  Agreement Not to Harm Self Yes  Description of Agreement Verbal Contract  Danger to Others  Danger to Others None reported or observed

## 2022-10-18 NOTE — Progress Notes (Signed)
   10/18/22 0800  Psych Admission Type (Psych Patients Only)  Admission Status Voluntary  Psychosocial Assessment  Patient Complaints Anxiety  Eye Contact Fair  Facial Expression Anxious  Affect Anxious  Speech Logical/coherent  Interaction Assertive  Motor Activity Slow  Appearance/Hygiene Unremarkable  Behavior Characteristics Anxious;Cooperative  Mood Pleasant  Thought Process  Coherency WDL  Content WDL  Delusions None reported or observed  Perception WDL  Hallucination None reported or observed  Judgment Poor  Confusion None  Danger to Self  Current suicidal ideation? Denies  Agreement Not to Harm Self Yes  Description of Agreement verbal contracts for safety  Danger to Others  Danger to Others None reported or observed
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
## 2022-10-18 NOTE — BH IP Treatment Plan (Signed)
Interdisciplinary Treatment and Diagnostic Plan Update  10/18/2022 Time of Session: 9:35am  Natasha Koch MRN: [id number]  Principal Diagnosis: MDD (major depressive disorder), recurrent episode ([place])  Secondary Diagnoses: Principal Problem:   MDD (major depressive disorder), recurrent episode ([place]) Active Problems:   Panic disorder   Current Medications:  Current Facility-Administered Medications  Medication Dose Route Frequency Provider Last Rate Last Admin   acetaminophen (TYLENOL) tablet 650 mg  650 mg Oral Q6H PRN Derrill Center, NP       alum & mag hydroxide-simeth (MAALOX/MYLANTA) 200-200-20 MG/5ML suspension 30 mL  30 mL Oral Q4H PRN Derrill Center, NP       hydrOXYzine (ATARAX) tablet 25 mg  25 mg Oral TID PRN Derrill Center, NP   25 mg at 10/17/22 2111   LORazepam (ATIVAN) tablet 0.5 mg  0.5 mg Oral Q6H PRN Rolanda Lundborg, MD   0.5 mg at 10/17/22 1452   magnesium hydroxide (MILK OF MAGNESIA) suspension 30 mL  30 mL Oral Daily PRN Derrill Center, NP       sertraline (ZOLOFT) tablet 50 mg  50 mg Oral Daily Derrill Center, NP   50 mg at 10/18/22 0815   traZODone (DESYREL) tablet 50 mg  50 mg Oral QHS PRN Derrill Center, NP   50 mg at 10/17/22 2111   PTA Medications: Medications Prior to Admission  Medication Sig Dispense Refill Last Dose   azelastine (ASTELIN) 0.1 % nasal spray Place 2 sprays into both nostrils 2 (two) times daily. (Patient not taking: Reported on 10/17/2022)   Not Taking   azithromycin (ZITHROMAX) 250 MG tablet Take 250 mg by mouth as directed. (Patient not taking: Reported on 10/17/2022)   Completed Course   dicyclomine (BENTYL) 20 MG tablet Take 1 tablet (20 mg total) by mouth 2 (two) times daily as needed for spasms. (Patient not taking: Reported on 10/17/2022) 20 tablet 0 Not Taking   ondansetron (ZOFRAN) 4 MG tablet Take 1 tablet (4 mg total) by mouth every 6 (six) hours as needed for nausea or vomiting. (Patient not taking: Reported on  10/17/2022) 14 tablet 0 Completed Course   predniSONE (DELTASONE) 20 MG tablet Take 40 mg by mouth daily. (Patient not taking: Reported on 10/17/2022)   Completed Course   promethazine-dextromethorphan (PROMETHAZINE-DM) 6.25-15 MG/5ML syrup SMARTSIG:5-10 Milliliter(s) By Mouth Every 6 Hours PRN (Patient not taking: Reported on 10/17/2022)   Not Taking    Patient Stressors: Financial difficulties   Marital or family conflict    Patient Strengths: Marketing executive fund of knowledge  Motivation for treatment/growth   Treatment Modalities: Medication Management, Group therapy, Case management,  1 to 1 session with clinician, Psychoeducation, Recreational therapy.   Physician Treatment Plan for Primary Diagnosis: MDD (major depressive disorder), recurrent episode ([place]) Long Term Goal(s):     Short Term Goals: Ability to disclose and discuss suicidal ideas Ability to identify and develop effective coping behaviors will improve Compliance with prescribed medications will improve  Medication Management: Evaluate patient's response, side effects, and tolerance of medication regimen.  Therapeutic Interventions: 1 to 1 sessions, Unit Group sessions and Medication administration.  Evaluation of Outcomes: Not Met  Physician Treatment Plan for Secondary Diagnosis: Principal Problem:   MDD (major depressive disorder), recurrent episode ([place]) Active Problems:   Panic disorder  Long Term Goal(s):     Short Term Goals: Ability to disclose and discuss suicidal ideas Ability to identify and develop effective coping behaviors will improve Compliance with  prescribed medications will improve     Medication Management: Evaluate patient's response, side effects, and tolerance of medication regimen.  Therapeutic Interventions: 1 to 1 sessions, Unit Group sessions and Medication administration.  Evaluation of Outcomes: Not Met   RN Treatment Plan for Primary Diagnosis: MDD (major  depressive disorder), recurrent episode ([place]) Long Term Goal(s): Knowledge of disease and therapeutic regimen to maintain health will improve  Short Term Goals: Ability to remain free from injury will improve, Ability to participate in decision making will improve, Ability to verbalize feelings will improve, Ability to disclose and discuss suicidal ideas, and Ability to identify and develop effective coping behaviors will improve  Medication Management: RN will administer medications as ordered by provider, will assess and evaluate patient's response and provide education to patient for prescribed medication. RN will report any adverse and/or side effects to prescribing provider.  Therapeutic Interventions: 1 on 1 counseling sessions, Psychoeducation, Medication administration, Evaluate responses to treatment, Monitor vital signs and CBGs as ordered, Perform/monitor CIWA, COWS, AIMS and Fall Risk screenings as ordered, Perform wound care treatments as ordered.  Evaluation of Outcomes: Not Met   LCSW Treatment Plan for Primary Diagnosis: MDD (major depressive disorder), recurrent episode ([place]) Long Term Goal(s): Safe transition to appropriate next level of care at discharge, Engage patient in therapeutic group addressing interpersonal concerns.  Short Term Goals: Engage patient in aftercare planning with referrals and resources, Increase social support, Increase emotional regulation, Facilitate acceptance of mental health diagnosis and concerns, Identify triggers associated with mental health/substance abuse issues, and Increase skills for wellness and recovery  Therapeutic Interventions: Assess for all discharge needs, 1 to 1 time with Social worker, Explore available resources and support systems, Assess for adequacy in community support network, Educate family and significant other(s) on suicide prevention, Complete Psychosocial Assessment, Interpersonal group therapy.  Evaluation of Outcomes:  Not Met   Progress in Treatment: Attending groups: Yes. Participating in groups: Yes. Taking medication as prescribed: Yes. Toleration medication: Yes. Family/Significant other contact made: Yes, individual(s) contacted:  Patient declined consents  Patient understands diagnosis: Yes. Discussing patient identified problems/goals with staff: Yes. Medical problems stabilized or resolved: Yes. Denies suicidal/homicidal ideation: Yes. Issues/concerns per patient self-inventory: No.   New problem(s) identified: No, Describe:  None   New Short Term/Long Term Goal(s): medication stabilization, elimination of SI thoughts, development of comprehensive mental wellness plan.   Patient Goals: "Try medications and manage my anxiety"  Discharge Plan or Barriers: Patient recently admitted. CSW will continue to follow and assess for appropriate referrals and possible discharge planning.   Reason for Continuation of Hospitalization: Anxiety Depression Medication stabilization Suicidal ideation  Estimated Length of Stay: 3 to 7 days   Last [place] Suicide Severity Risk Score: Flowsheet Row Admission (Current) from 10/16/2022 in [facility] ED from 10/04/2022 in [place] ED from 04/03/2022 in [place] Low Risk No Risk No Risk       Last PHQ 2/9 Scores:     No data to display          Scribe for Treatment Team: Darleen Crocker, Latanya Presser 10/18/2022 1:28 PM

## 2022-10-18 NOTE — Plan of Care (Signed)
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
## 2022-10-18 NOTE — Group Note (Signed)
LCSW Group Therapy Note   Group Date: 10/17/2022 Start Time: 1100 End Time: 1200   Type of Therapy and Topic:  Group Therapy: Boundaries  Participation Level:  Active  Description of Group: This group will address the use of boundaries in their personal lives. Patients will explore why boundaries are important, the difference between healthy and unhealthy boundaries, and negative and postive outcomes of different boundaries and will look at how boundaries can be crossed.  Patients will be encouraged to identify current boundaries in their own lives and identify what kind of boundary is being set. Facilitators will guide patients in utilizing problem-solving interventions to address and correct types boundaries being used and to address when no boundary is being used. Understanding and applying boundaries will be explored and addressed for obtaining and maintaining a balanced life. Patients will be encouraged to explore ways to assertively make their boundaries and needs known to significant others in their lives, using other group members and facilitator for role play, support, and feedback.  Therapeutic Goals:  1.  Patient will identify areas in their life where setting clear boundaries could be  used to improve their life.  2.  Patient will identify signs/triggers that a boundary is not being respected. 3.  Patient will identify two ways to set boundaries in order to achieve balance in  their lives: 4.  Patient will demonstrate ability to communicate their needs and set boundaries  through discussion and/or role plays  Summary of Patient Progress:  Natasha Koch was present/active throughout the session and proved open to feedback from CSW and peers. Patient demonstrated good insight into the subject matter, was respectful of peers, and was present throughout the entire session.  Therapeutic Modalities:   Cognitive Behavioral Therapy Solution-Focused Therapy  Ane Payment, LCSW 10/18/2022   11:46 AM

## 2022-10-19 DIAGNOSIS — F332 Major depressive disorder, recurrent severe without psychotic features: Secondary | ICD-10-CM

## 2022-10-19 MED ORDER — TRAZODONE HCL 50 MG PO TABS: 50.0000 mg | ORAL_TABLET | Freq: Every evening | ORAL | 0 refills | Status: DC | PRN

## 2022-10-19 MED ORDER — SERTRALINE HCL 50 MG PO TABS: 50.0000 mg | ORAL_TABLET | Freq: Every day | ORAL | 0 refills | Status: DC

## 2022-10-19 MED ORDER — HYDROXYZINE HCL 25 MG PO TABS: 25.0000 mg | ORAL_TABLET | Freq: Three times a day (TID) | ORAL | 0 refills | Status: AC | PRN

## 2022-10-19 MED ORDER — LORAZEPAM 0.5 MG PO TABS: 0.5000 mg | ORAL_TABLET | Freq: Four times a day (QID) | ORAL | 0 refills | Status: DC | PRN

## 2022-10-19 NOTE — Progress Notes (Signed)
   10/18/22 2200  Psych Admission Type (Psych Patients Only)  Admission Status Voluntary  Psychosocial Assessment  Patient Complaints Anxiety  Eye Contact Fair  Facial Expression Anxious  Affect Anxious  Speech Logical/coherent  Interaction Assertive  Motor Activity Slow  Appearance/Hygiene Unremarkable  Behavior Characteristics Cooperative  Mood Pleasant  Thought Process  Coherency WDL  Content WDL  Delusions None reported or observed  Perception WDL  Hallucination None reported or observed  Judgment Poor  Confusion None  Danger to Self  Current suicidal ideation? Denies  Agreement Not to Harm Self Yes  Description of Agreement Verbal  Danger to Others  Danger to Others None reported or observed
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
## 2022-10-19 NOTE — Progress Notes (Signed)
Patient discharged from [facility] on 10/19/2022 at 11:30am. Patient denies SI, plan, and intention. Suicide safety plan completed, reviewed with this RN, given to the patient, and a copy in the chart. Patient denies HI/AVH upon discharge. Patient rates her depression a 4/10 and her anxiety a 4/10. Patient is alert, oriented, and cooperative. RN provided patient with discharge paperwork and reviewed information with patient. Patient expressed that she understood all of the discharge instructions. Pt was satisfied with belongings returned to her from the locker and at bedside. Discharged patient to [facility] waiting room. Pt's taxi driver awaiting patient in the [facility] waiting room.

## 2022-10-19 NOTE — Progress Notes (Signed)
  [facility] Adult Case Management Discharge Plan :  Will you be returning to the same living situation after discharge:  Yes,  Carlean Jews [phone] (Friend) At discharge, do you have transportation home?: No. Carlean Jews [phone] Do you have the ability to pay for your medications: Yes,  Insured  Release of information consent forms completed and in the chart;  Patient's signature needed at discharge.  Patient to Follow up at:  Follow-up Information     [facility]. Go on 10/23/2022.   Why: You have a hospital follow up appointment to obtain medication management services on 10/23/22 at 8:00 am.  This appointment will be held in person. Contact information: [address] [place] [address] [phone]         [facility] Follow up on 10/23/2022.   Why: You have an appointment for individual therapy services on 10/23/22 at 4:00 pm.   This will be a Virtual appointment. Contact information: [address] [place], [place] [address] [phone]                 Next level of care provider has access to [facility] Link:yes  Safety Planning and Suicide Prevention discussed: Valentino Hue,  Carlean Jews [phone]     Has patient been referred to the Quitline?: Patient refused referral  Patient has been referred for addiction treatment: N/A Patient to continue working towards treatment goals after discharge. Patient no longer meets criteria for inpatient criteria per attending physician. Continue taking medications as prescribed, nursing to provide instructions at discharge. Follow up with all scheduled appointments.    Jaynia Fendley S Sanjuan Sawa, LCSW 10/19/2022, 10:25 AM

## 2022-10-19 NOTE — Discharge Instructions (Addendum)
-  Follow-up with your outpatient psychiatric provider -instructions on appointment date, time, and address (location) are provided to you in discharge paperwork.  -Take your psychiatric medications as prescribed at discharge - instructions are provided to you in the discharge paperwork. -Please take your hydroxyzine as needed for mild anxiety -Please take your ativan as needed when you feel you are about to have a panic attack  -Follow-up with outpatient primary care doctor and other specialists -for management of preventative medicine and any chronic medical disease.  -Recommend abstinence from alcohol, tobacco, and other illicit drug use at discharge.   -If your psychiatric symptoms recur, worsen, or if you have side effects to your psychiatric medications, call your outpatient psychiatric provider, 911, 988 or go to the nearest emergency department.  -If suicidal thoughts occur, call your outpatient psychiatric provider, 911, 988 or go to the nearest emergency department.  Take care!  Dr. Lurline Hare  Naloxone (Narcan) can help reverse an overdose when given to the victim quickly.  [facility] offers free naloxone kits and instructions/training on its use.  Add naloxone to your first aid kit and you can help save a life.   Pick up your free kit at the following locations:   [place]:  [place], [place] [address] [phone]) [facility] [place] [address] [phone]) [facility] [place]  [place]: [place] [address] [place] [address] [phone]) [facility] [place] [address] [phone])

## 2022-10-19 NOTE — [facility] Suicide Risk Assessment (Signed)
[facility] INPATIENT:  Family/Significant Other Suicide Prevention Education  Suicide Prevention Education:  Education Completed; 10-19-2022, Carlean Jews [phone] (friend/Neighbor) has been identified by the patient as the family member/significant other with whom the patient will be residing, and identified as the person(s) who will aid the patient in the event of a mental health crisis (suicidal ideations/suicide attempt).  With written consent from the patient, the family member/significant other has been provided the following suicide prevention education, prior to the and/or following the discharge of the patient.  The suicide prevention education provided includes the following: Suicide risk factors Suicide prevention and interventions National Suicide Hotline telephone number [facility] assessment telephone number [facility] Emergency Assistance 911 [facility] and/or Residential Mobile Crisis Unit telephone number  Request made of family/significant other to: Remove weapons (e.g., guns, rifles, knives), all items previously/currently identified as safety concern.   Remove drugs/medications (over-the-counter, prescriptions, illicit drugs), all items previously/currently identified as a safety concern.  Carlean Jews [phone] friendverbalizes understanding of the suicide prevention education information provided.  The family member/significant other agrees to remove the items of safety concern listed above.  Ronnell Freshwater Baillie Mohammad MSW, LCSW 10/19/2022, 10:22 AM

## 2022-10-19 NOTE — Progress Notes (Signed)
D- Patient alert and oriented. Patient affect/mood.reported as improving.  Denies SI, HI, AVH, and pain. Reports having a panic attack yesterday and was medicated for but she has been doing okay today. A- PRN medications administered to patient, patient states "I'm not anxious I just want something to help me sleep." Support and encouragement provided.  Routine safety checks conducted every 15 minutes.  Patient informed to notify staff with problems or concerns. R- No adverse drug reactions noted. Patient contracts for safety at this time. Patient compliant with medications and treatment plan. Patient receptive, calm, and cooperative. Patient interacts well with others on the unit.  Patient remains safe at this time.

## 2023-06-21 NOTE — Progress Notes (Deleted)
Psychiatric Initial Adult Assessment   Patient Identification: Natasha Koch MRN:  [id number] Date of Evaluation:  06/21/2023 Referral Source: *** Chief Complaint:  No chief complaint on file.  Visit Diagnosis: No diagnosis found.   Assessment:  Natasha Koch is a 27 y.o. female with a history of *** who presents virtually to [facility] at [facility] for initial evaluation on 06/21/2023.  Patient reports ***  A number of assessments were performed during the evaluation today including PHQ-9 which they scored a *** on, GAD-7 which they scored a *** on, and [place] suicide severity screening which showed ***.  Based on these assessments patient would benefit from medication adjustment to better target their symptoms.  Plan: -  -  -  - Crisis resources reviewed - Follow up in  History of Present Illness:  ***  Associated Signs/Symptoms: Depression Symptoms:  {DEPRESSION SYMPTOMS:20000} (Hypo) Manic Symptoms:  {BHH MANIC SYMPTOMS:22872} Anxiety Symptoms:  {BHH ANXIETY SYMPTOMS:22873} Psychotic Symptoms:  {BHH PSYCHOTIC SYMPTOMS:22874} PTSD Symptoms: {BHH PTSD SYMPTOMS:22875}  Past Psychiatric History: ***  Previous Psychotropic Medications: {YES/NO:21197}  Substance Abuse History in the last 12 months:  {yes no:314532}  Consequences of Substance Abuse: {BHH CONSEQUENCES OF SUBSTANCE ABUSE:22880}  Past Medical History:  Past Medical History:  Diagnosis Date   History of hiatal hernia    Medical history non-contributory     Past Surgical History:  Procedure Laterality Date   CHOLECYSTECTOMY      Family Psychiatric History: ***  Family History:  Family History  Problem Relation Age of Onset   Diabetes Father    Diabetes Maternal Grandmother    Diabetes Maternal Grandfather    Diabetes Paternal Grandmother    Diabetes Paternal Grandfather     Social History:   Social History   Socioeconomic History   Marital status: Married     Spouse name: Alecia Lemming   Number of children: 1   Years of education: Not on file   Highest education level: Not on file  Occupational History   Not on file  Tobacco Use   Smoking status: Never   Smokeless tobacco: Never  Vaping Use   Vaping status: Never Used  Substance and Sexual Activity   Alcohol use: Not Currently   Drug use: Never   Sexual activity: Yes    Birth control/protection: None  Other Topics Concern   Not on file  Social History Narrative   Not on file   Social Determinants of Health   Financial Resource Strain: Not on file  Food Insecurity: Patient Declined (10/17/2022)   Hunger Vital Sign    Worried About Running Out of Food in the Last Year: Patient declined    Ran Out of Food in the Last Year: Patient declined  Transportation Needs: No Transportation Needs (10/17/2022)   PRAPARE - Administrator, Civil Service (Medical): No    Lack of Transportation (Non-Medical): No  Physical Activity: Not on file  Stress: Not on file  Social Connections: Not on file    Additional Social History: ***  Allergies:  No Known Allergies  Metabolic Disorder Labs: No results found for: "HGBA1C", "MPG" No results found for: "PROLACTIN" No results found for: "CHOL", "TRIG", "HDL", "CHOLHDL", "VLDL", "LDLCALC" Lab Results  Component Value Date   TSH 4.289 10/17/2022    Therapeutic Level Labs: No results found for: "LITHIUM" No results found for: "CBMZ" No results found for: "VALPROATE"  Current Medications: Current Outpatient Medications  Medication Sig Dispense Refill   azelastine (  ASTELIN) 0.1 % nasal spray Place 2 sprays into both nostrils 2 (two) times daily. (Patient not taking: Reported on 10/17/2022)     dicyclomine (BENTYL) 20 MG tablet Take 1 tablet (20 mg total) by mouth 2 (two) times daily as needed for spasms. (Patient not taking: Reported on 10/17/2022) 20 tablet 0   LORazepam (ATIVAN) 0.5 MG tablet Take 1 tablet (0.5 mg total) by mouth every 6  (six) hours as needed for up to 10 doses (for panic attack only). 10 tablet 0   ondansetron (ZOFRAN) 4 MG tablet Take 1 tablet (4 mg total) by mouth every 6 (six) hours as needed for nausea or vomiting. (Patient not taking: Reported on 10/17/2022) 14 tablet 0   sertraline (ZOLOFT) 50 MG tablet Take 1 tablet (50 mg total) by mouth daily. 30 tablet 0   traZODone (DESYREL) 50 MG tablet Take 1 tablet (50 mg total) by mouth at bedtime as needed for sleep. 30 tablet 0   No current facility-administered medications for this visit.    Psychiatric Specialty Exam: Review of Systems  unknown if currently breastfeeding.There is no height or weight on file to calculate BMI.  General Appearance: {Appearance:22683}  Eye Contact:  {BHH EYE CONTACT:22684}  Speech:  {Speech:22685}  Volume:  {Volume (PAA):22686}  Mood:  {BHH MOOD:22306}  Affect:  {Affect (PAA):22687}  Thought Process:  {Thought Process (PAA):22688}  Orientation:  {BHH ORIENTATION (PAA):22689}  Thought Content:  {Thought Content:22690}  Suicidal Thoughts:  {ST/HT (PAA):22692}  Homicidal Thoughts:  {ST/HT (PAA):22692}  Memory:  {BHH MEMORY:22881}  Judgement:  {Judgement (PAA):22694}  Insight:  {Insight (PAA):22695}  Psychomotor Activity:  {Psychomotor (PAA):22696}  Concentration:  {Concentration:21399}  Recall:  {BHH GOOD/FAIR/POOR:22877}  Fund of Knowledge:{BHH GOOD/FAIR/POOR:22877}  Language: {BHH GOOD/FAIR/POOR:22877}  Akathisia:  {BHH YES OR NO:22294}    AIMS (if indicated):  {Desc; done/not:10129}  Assets:  {Assets (PAA):22698}  ADL's:  {BHH WUJ'W:11914}  Cognition: {chl bhh cognition:304700322}  Sleep:  {BHH GOOD/FAIR/POOR:22877}   Screenings: AUDIT    Flowsheet Row [facility]  Alcohol Use Disorder Identification Test Final Score (AUDIT) 0      Flowsheet Row Ad[facility]) [facility] ED from 10/04/2022 in [facility] at [facility] ED from 04/03/2022 in [facility] at [facility]  C-SSRS RISK CATEGORY Low Risk No Risk No Risk        Collaboration of Care: {BH OP Collaboration of Care:21014065}  Patient/Guardian was advised Release of Information must be obtained prior to any record release in order to collaborate their care with an outside provider. Patient/Guardian was advised if they have not already done so to contact the registration department to sign all necessary forms in order for [place] to release information regarding their care.   Consent: Patient/Guardian gives verbal consent for treatment and assignment of benefits for services provided during this visit. Patient/Guardian expressed understanding and agreed to proceed.   Stasia Cavalier, MD 8/1/20245:55 PM    Virtual Visit via Video Note  I connected with Natasha Koch on 06/21/23 at  9:00 AM EDT by a video enabled telemedicine application and verified that I am speaking with the correct person using two identifiers.  Location: Patient: *** Provider: Home office   I discussed the limitations of evaluation and management by telemedicine and the availability of in person appointments. The patient expressed understanding and agreed to proceed.   I discussed  the assessment and treatment plan with the patient. The patient was provided an opportunity to ask questions and all were answered. The patient agreed with the plan and demonstrated an understanding of the instructions.   The patient was advised to call back or seek an in-person evaluation if the symptoms worsen or if the condition fails to improve as anticipated.  I provided *** minutes of non-face-to-face time during this encounter.   Stasia Cavalier, MD

## 2023-06-22 ENCOUNTER — Encounter (HOSPITAL_COMMUNITY): Payer: MEDICAID | Admitting: Psychiatry

## 2023-06-22 ENCOUNTER — Encounter (HOSPITAL_COMMUNITY): Payer: Self-pay

## 2023-06-22 NOTE — Progress Notes (Signed)
This encounter was created in error - please disregard.

## 2023-06-25 ENCOUNTER — Telehealth (HOSPITAL_COMMUNITY): Payer: Self-pay | Admitting: General Practice

## 2023-06-25 NOTE — Telephone Encounter (Signed)
06/25/23 11:03am Called and left msg for pt to call the office due to her missed appt and insurance.  The patient has medicaid and lives in [place] the patient will have to see a provider in [facility] because of her insurance.  [facility] [phone] - waiting for a call back./sh

## 2023-07-24 ENCOUNTER — Ambulatory Visit (HOSPITAL_COMMUNITY): Payer: Medicaid Other | Admitting: Psychiatry

## 2023-07-25 ENCOUNTER — Ambulatory Visit (HOSPITAL_COMMUNITY): Payer: Medicaid Other | Admitting: Psychiatry

## 2023-11-21 NOTE — L&D Delivery Note (Signed)
 Delivery Note At 8:02 AM a viable and healthy female was delivered via Vaginal, Spontaneous (Presentation:  direct Occiput Anterior).  APGAR: 8, 9; weight pending.   Placenta status: Spontaneous, Intact.  Cord: 3 vessels with loose nuchal cord reduced on the perineum.  Patient pushed with 2 contractions and delivered the infant's head in direct occiput anterior presentation with restitution to LOA.  A loose nuchal cord was noted and reduced on the perineum.  The first attempt of delivery of the shoulder the anterior shoulder did not want to deliver however the posterior shoulder and arm were delivering.  The infant's posterior hand was grasped and delivered and the infant was then rotated to the left to allow for the anterior shoulder to deliver.  The infant was then passed to the waiting maternal abdomen.  The infant was bulb suctioned and cried.  Following a 1 minute delay the cord was clamped and cut.  The placenta was delivered spontaneously, intact, with three-vessel cord.  A right labial laceration was repaired with 4-0 Vicryl interrupted suture.  All sponge, needle, instrument counts were correct.  Mom and baby are doing well following delivery.    Anesthesia: Epidural Episiotomy: None Lacerations: Labial Suture Repair: 4-0 Vicryl Est. Blood Loss (mL): 307  Mom to postpartum.  Baby to Couplet care / Skin to Skin.  Marjorie Gull 06/14/2024, 8:36 AM

## 2023-11-22 LAB — OB RESULTS CONSOLE RUBELLA ANTIBODY, IGM: Rubella: IMMUNE

## 2023-11-22 LAB — OB RESULTS CONSOLE RPR: RPR: NONREACTIVE

## 2023-11-22 LAB — OB RESULTS CONSOLE GC/CHLAMYDIA
Chlamydia: NEGATIVE
Neisseria Gonorrhea: NEGATIVE

## 2023-11-22 LAB — OB RESULTS CONSOLE HIV ANTIBODY (ROUTINE TESTING): HIV: NONREACTIVE

## 2023-11-22 LAB — HEPATITIS C ANTIBODY: HCV Ab: NEGATIVE

## 2023-11-22 LAB — OB RESULTS CONSOLE HEPATITIS B SURFACE ANTIGEN: Hepatitis B Surface Ag: NEGATIVE

## 2023-11-22 LAB — OB RESULTS CONSOLE ANTIBODY SCREEN: Antibody Screen: NEGATIVE

## 2023-11-27 ENCOUNTER — Other Ambulatory Visit: Payer: Self-pay

## 2023-11-27 ENCOUNTER — Inpatient Hospital Stay (HOSPITAL_COMMUNITY)
Admission: AD | Admit: 2023-11-27 | Discharge: 2023-11-27 | Disposition: A | Discharge status: Home / Self Care | Payer: Self-pay | Attending: Student | Admitting: Student

## 2023-11-27 ENCOUNTER — Encounter (HOSPITAL_COMMUNITY): Payer: Self-pay | Admitting: Emergency Medicine

## 2023-11-27 DIAGNOSIS — U071 COVID-19: Secondary | ICD-10-CM | POA: Insufficient documentation

## 2023-11-27 DIAGNOSIS — O98511 Other viral diseases complicating pregnancy, first trimester: Secondary | ICD-10-CM | POA: Insufficient documentation

## 2023-11-27 DIAGNOSIS — Z3A12 12 weeks gestation of pregnancy: Secondary | ICD-10-CM | POA: Insufficient documentation

## 2023-11-27 DIAGNOSIS — O219 Vomiting of pregnancy, unspecified: Secondary | ICD-10-CM

## 2023-11-27 LAB — URINALYSIS, ROUTINE W REFLEX MICROSCOPIC
Bilirubin Urine: NEGATIVE
Glucose, UA: NEGATIVE mg/dL
Hgb urine dipstick: NEGATIVE
Ketones, ur: NEGATIVE mg/dL
Nitrite: NEGATIVE
Protein, ur: NEGATIVE mg/dL
Specific Gravity, Urine: 1.016 (ref 1.005–1.030)
pH: 5 (ref 5.0–8.0)

## 2023-11-27 LAB — BASIC METABOLIC PANEL
Anion gap: 9 (ref 5–15)
BUN: <5 mg/dL — ABNORMAL LOW (ref 6–20)
CO2: 22 mmol/L (ref 22–32)
Calcium: 9.2 mg/dL (ref 8.9–10.3)
Chloride: 104 mmol/L (ref 98–111)
Creatinine, Ser: 0.45 mg/dL (ref 0.44–1.00)
GFR, Estimated: >60 mL/min (ref >60)
Glucose, Bld: 105 mg/dL — ABNORMAL HIGH (ref 70–99)
Potassium: 3.9 mmol/L (ref 3.5–5.1)
Sodium: 135 mmol/L (ref 135–145)

## 2023-11-27 LAB — CBC
HCT: 36.3 % (ref 36.0–46.0)
Hemoglobin: 12.7 g/dL (ref 12.0–15.0)
MCH: 30.1 pg (ref 26.0–34.0)
MCHC: 35 g/dL (ref 30.0–36.0)
MCV: 86 fL (ref 80.0–100.0)
Platelets: 299 10*3/uL (ref 150–400)
RBC: 4.22 MIL/uL (ref 3.87–5.11)
RDW: 12.6 % (ref 11.5–15.5)
WBC: 11.7 10*3/uL — ABNORMAL HIGH (ref 4.0–10.5)
nRBC: 0 % (ref 0.0–0.2)

## 2023-11-27 LAB — RESP PANEL BY RT-PCR (RSV, FLU A&B, COVID)  RVPGX2
Influenza A by PCR: NEGATIVE
Influenza B by PCR: NEGATIVE
Resp Syncytial Virus by PCR: NEGATIVE
SARS Coronavirus 2 by RT PCR: POSITIVE — AB

## 2023-11-27 LAB — HCG, QUANTITATIVE, PREGNANCY: hCG, Beta Chain, Quant, S: 64475 m[IU]/mL — ABNORMAL HIGH (ref <5)

## 2023-11-27 MED ORDER — GUAIFENESIN ER 600 MG PO TB12: 600.0000 mg | ORAL_TABLET | Freq: Two times a day (BID) | ORAL | 0 refills | Status: DC | PRN

## 2023-11-27 MED ORDER — FLUTICASONE PROPIONATE 50 MCG/ACT NA SUSP: 2.0000 | Freq: Every day | NASAL | 0 refills | Status: DC

## 2023-11-27 MED ORDER — ONDANSETRON 4 MG PO TBDP
4.0000 mg | ORAL_TABLET | Freq: Once | ORAL | Status: AC
  Administered 2023-11-27: 4 mg via ORAL
  Filled 2023-11-27: qty 1

## 2023-11-27 MED ORDER — PROMETHAZINE HCL 25 MG PO TABS: 25.0000 mg | ORAL_TABLET | Freq: Four times a day (QID) | ORAL | 2 refills | Status: DC | PRN

## 2023-11-27 MED ORDER — ONDANSETRON 4 MG PO TBDP: 4.0000 mg | ORAL_TABLET | Freq: Three times a day (TID) | ORAL | 0 refills | Status: DC | PRN

## 2023-11-27 MED ORDER — ONDANSETRON 4 MG PO TBDP: 4.0000 mg | ORAL_TABLET | Freq: Four times a day (QID) | ORAL | 0 refills | Status: DC | PRN

## 2023-11-27 MED ORDER — ACETAMINOPHEN-CAFFEINE 500-65 MG PO TABS
2.0000 | ORAL_TABLET | Freq: Once | ORAL | Status: AC
  Administered 2023-11-27: 2 via ORAL
  Filled 2023-11-27: qty 2

## 2023-11-27 NOTE — Progress Notes (Signed)
 Written and verbal d/c instructions given and understanding voiced.

## 2023-11-27 NOTE — ED Notes (Signed)
 Report called to MAU

## 2023-11-27 NOTE — ED Provider Triage Note (Signed)
 Emergency Medicine Provider Triage Evaluation Note  Natasha Koch , a 28 y.o. female 3 months pregnant was evaluated in triage.  Pt complains of cough, congestion, nausea, vomiting over the past few weeks.  Review of Systems  Positive:  Negative:   Physical Exam  BP 124/80 (BP Location: Right Arm)   Pulse 89   Temp 98.7 F (37.1 C)   Resp 16   LMP  (Approximate)   SpO2 97%  Gen:   Awake, no distress   Resp:  Normal effort  MSK:   Moves extremities without difficulty  Other:  Abdomen without focal tenderness  Medical Decision Making  Medically screening exam initiated at 6:39 PM.  Appropriate orders placed.  Burnard JAYSON Fail was informed that the remainder of the evaluation will be completed by another provider, this initial triage assessment does not replace that evaluation, and the importance of remaining in the ED until their evaluation is complete.     Donnajean Lynwood DEL, PA-C 11/27/23 1849

## 2023-11-27 NOTE — ED Triage Notes (Signed)
 Pt 3 months pregnant. Pt complains of vomiting, dizziness, and congestion x 1 week.

## 2023-11-27 NOTE — MAU Note (Addendum)
.  Natasha Koch is a 28 y.o. at [redacted]w[redacted]d here in MAU reporting going to main ED due to feeling light headed and almost passed out at work. Reports having cold symptoms for 2 wks. Has a h/a present for a week. Has tried Ibuprofen  but does not help. Sitting in dark room helps h/a. Has n/v from drainage as the emesis is mucous. Pt came over from the main ED.    Onset of complaint: 1wk Pain score: 8 Vitals:   11/27/23 2019 11/27/23 2023  BP:  119/62  Pulse: 85   Resp: 17   Temp: 98.5 F (36.9 C)   SpO2: 99%      FHT:153 Lab orders placed from triage: none
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
## 2024-04-08 ENCOUNTER — Other Ambulatory Visit: Payer: Self-pay | Admitting: Obstetrics and Gynecology

## 2024-04-08 DIAGNOSIS — N649 Disorder of breast, unspecified: Secondary | ICD-10-CM

## 2024-04-08 DIAGNOSIS — L988 Other specified disorders of the skin and subcutaneous tissue: Secondary | ICD-10-CM

## 2024-05-14 LAB — OB RESULTS CONSOLE GBS: GBS: NEGATIVE

## 2024-06-11 ENCOUNTER — Telehealth (HOSPITAL_COMMUNITY): Payer: Self-pay | Admitting: *Deleted

## 2024-06-11 ENCOUNTER — Encounter (HOSPITAL_COMMUNITY): Payer: Self-pay | Admitting: *Deleted

## 2024-06-11 NOTE — Telephone Encounter (Signed)
 Preadmission screen

## 2024-06-14 ENCOUNTER — Other Ambulatory Visit: Payer: Self-pay

## 2024-06-14 ENCOUNTER — Inpatient Hospital Stay (HOSPITAL_COMMUNITY): Admitting: Anesthesiology

## 2024-06-14 ENCOUNTER — Inpatient Hospital Stay (HOSPITAL_COMMUNITY)
Admission: AD | Admit: 2024-06-14 | Discharge: 2024-06-15 | DRG: 807 | Disposition: A | Discharge status: Home / Self Care | Attending: Obstetrics and Gynecology | Admitting: Obstetrics and Gynecology

## 2024-06-14 ENCOUNTER — Encounter (HOSPITAL_COMMUNITY): Payer: Self-pay | Admitting: Obstetrics and Gynecology

## 2024-06-14 DIAGNOSIS — Z833 Family history of diabetes mellitus: Secondary | ICD-10-CM | POA: Diagnosis not present

## 2024-06-14 DIAGNOSIS — K429 Umbilical hernia without obstruction or gangrene: Secondary | ICD-10-CM | POA: Diagnosis present

## 2024-06-14 DIAGNOSIS — O99892 Other specified diseases and conditions complicating childbirth: Secondary | ICD-10-CM | POA: Diagnosis present

## 2024-06-14 DIAGNOSIS — Z3A40 40 weeks gestation of pregnancy: Secondary | ICD-10-CM

## 2024-06-14 DIAGNOSIS — O479 False labor, unspecified: Principal | ICD-10-CM | POA: Diagnosis present

## 2024-06-14 DIAGNOSIS — O26893 Other specified pregnancy related conditions, third trimester: Secondary | ICD-10-CM | POA: Diagnosis present

## 2024-06-14 HISTORY — DX: Depression, unspecified: F32.A

## 2024-06-14 HISTORY — DX: Anxiety disorder, unspecified: F41.9

## 2024-06-14 LAB — TYPE AND SCREEN
ABO/RH(D): B POS
Antibody Screen: NEGATIVE

## 2024-06-14 LAB — RPR: RPR Ser Ql: NONREACTIVE

## 2024-06-14 LAB — CBC
HCT: 34.8 % — ABNORMAL LOW (ref 36.0–46.0)
Hemoglobin: 11.7 g/dL — ABNORMAL LOW (ref 12.0–15.0)
MCH: 27.5 pg (ref 26.0–34.0)
MCHC: 33.6 g/dL (ref 30.0–36.0)
MCV: 81.9 fL (ref 80.0–100.0)
Platelets: 279 K/uL (ref 150–400)
RBC: 4.25 MIL/uL (ref 3.87–5.11)
RDW: 14.4 % (ref 11.5–15.5)
WBC: 14 K/uL — ABNORMAL HIGH (ref 4.0–10.5)
nRBC: 0 % (ref 0.0–0.2)

## 2024-06-14 MED ORDER — PHENYLEPHRINE 80 MCG/ML (10ML) SYRINGE FOR IV PUSH (FOR BLOOD PRESSURE SUPPORT): 80.0000 ug | PREFILLED_SYRINGE | INTRAVENOUS | Status: DC | PRN

## 2024-06-14 MED ORDER — OXYTOCIN-SODIUM CHLORIDE 30-0.9 UT/500ML-% IV SOLN
1.0000 m[IU]/min | INTRAVENOUS | Status: DC
  Administered 2024-06-14: 2 m[IU]/min via INTRAVENOUS
  Filled 2024-06-14: qty 500

## 2024-06-14 MED ORDER — ONDANSETRON HCL 4 MG/2ML IJ SOLN: 4.0000 mg | Freq: Four times a day (QID) | INTRAMUSCULAR | Status: DC | PRN

## 2024-06-14 MED ORDER — LACTATED RINGERS IV SOLN: INTRAVENOUS | Status: DC

## 2024-06-14 MED ORDER — LACTATED RINGERS IV SOLN
500.0000 mL | Freq: Once | INTRAVENOUS | Status: AC
  Administered 2024-06-14: 500 mL via INTRAVENOUS

## 2024-06-14 MED ORDER — ACETAMINOPHEN 325 MG PO TABS
650.0000 mg | ORAL_TABLET | ORAL | Status: DC | PRN
  Filled 2024-06-14: qty 2

## 2024-06-14 MED ORDER — SIMETHICONE 80 MG PO CHEW: 80.0000 mg | CHEWABLE_TABLET | ORAL | Status: DC | PRN

## 2024-06-14 MED ORDER — ZOLPIDEM TARTRATE 5 MG PO TABS: 5.0000 mg | ORAL_TABLET | Freq: Every evening | ORAL | Status: DC | PRN

## 2024-06-14 MED ORDER — ONDANSETRON HCL 4 MG/2ML IJ SOLN: 4.0000 mg | INTRAMUSCULAR | Status: DC | PRN

## 2024-06-14 MED ORDER — COCONUT OIL OIL
1.0000 | TOPICAL_OIL | Status: DC | PRN
  Administered 2024-06-14: 1 via TOPICAL

## 2024-06-14 MED ORDER — TERBUTALINE SULFATE 1 MG/ML IJ SOLN: 0.2500 mg | Freq: Once | INTRAMUSCULAR | Status: DC | PRN

## 2024-06-14 MED ORDER — SOD CITRATE-CITRIC ACID 500-334 MG/5ML PO SOLN: 30.0000 mL | ORAL | Status: DC | PRN

## 2024-06-14 MED ORDER — EPHEDRINE 5 MG/ML INJ: 10.0000 mg | INTRAVENOUS | Status: DC | PRN

## 2024-06-14 MED ORDER — LIDOCAINE HCL (PF) 1 % IJ SOLN: 30.0000 mL | INTRAMUSCULAR | Status: DC | PRN

## 2024-06-14 MED ORDER — OXYCODONE-ACETAMINOPHEN 5-325 MG PO TABS: 2.0000 | ORAL_TABLET | ORAL | Status: DC | PRN

## 2024-06-14 MED ORDER — OXYCODONE HCL 5 MG PO TABS: 10.0000 mg | ORAL_TABLET | ORAL | Status: DC | PRN

## 2024-06-14 MED ORDER — OXYTOCIN-SODIUM CHLORIDE 30-0.9 UT/500ML-% IV SOLN: 2.5000 [IU]/h | INTRAVENOUS | Status: DC

## 2024-06-14 MED ORDER — DIPHENHYDRAMINE HCL 50 MG/ML IJ SOLN: 12.5000 mg | INTRAMUSCULAR | Status: DC | PRN

## 2024-06-14 MED ORDER — LACTATED RINGERS IV SOLN: 500.0000 mL | INTRAVENOUS | Status: DC | PRN

## 2024-06-14 MED ORDER — ACETAMINOPHEN 325 MG PO TABS
650.0000 mg | ORAL_TABLET | ORAL | Status: DC | PRN
  Administered 2024-06-15: 650 mg via ORAL
  Filled 2024-06-14: qty 2

## 2024-06-14 MED ORDER — DIPHENHYDRAMINE HCL 25 MG PO CAPS: 25.0000 mg | ORAL_CAPSULE | Freq: Four times a day (QID) | ORAL | Status: DC | PRN

## 2024-06-14 MED ORDER — SENNOSIDES-DOCUSATE SODIUM 8.6-50 MG PO TABS: 2.0000 | ORAL_TABLET | Freq: Every day | ORAL | Status: DC

## 2024-06-14 MED ORDER — PRENATAL MULTIVITAMIN CH
1.0000 | ORAL_TABLET | Freq: Every day | ORAL | Status: DC
  Administered 2024-06-14: 1 via ORAL
  Filled 2024-06-14: qty 1

## 2024-06-14 MED ORDER — LIDOCAINE HCL (PF) 1 % IJ SOLN
INTRAMUSCULAR | Status: DC | PRN
  Administered 2024-06-14 (×2): 4 mL via EPIDURAL

## 2024-06-14 MED ORDER — OXYTOCIN BOLUS FROM INFUSION
333.0000 mL | Freq: Once | INTRAVENOUS | Status: AC
  Administered 2024-06-14: 333 mL via INTRAVENOUS

## 2024-06-14 MED ORDER — OXYCODONE-ACETAMINOPHEN 5-325 MG PO TABS: 1.0000 | ORAL_TABLET | ORAL | Status: DC | PRN

## 2024-06-14 MED ORDER — METHYLERGONOVINE MALEATE 0.2 MG PO TABS: 0.2000 mg | ORAL_TABLET | ORAL | Status: DC | PRN

## 2024-06-14 MED ORDER — DIBUCAINE (PERIANAL) 1 % EX OINT: 1.0000 | TOPICAL_OINTMENT | CUTANEOUS | Status: DC | PRN

## 2024-06-14 MED ORDER — FENTANYL CITRATE (PF) 100 MCG/2ML IJ SOLN: 50.0000 ug | INTRAMUSCULAR | Status: DC | PRN

## 2024-06-14 MED ORDER — WITCH HAZEL-GLYCERIN EX PADS
1.0000 | MEDICATED_PAD | CUTANEOUS | Status: DC | PRN
Start: 2024-06-14 — End: 2024-06-15

## 2024-06-14 MED ORDER — OXYTOCIN-SODIUM CHLORIDE 30-0.9 UT/500ML-% IV SOLN: 2.5000 [IU]/h | INTRAVENOUS | Status: DC | PRN

## 2024-06-14 MED ORDER — FENTANYL-BUPIVACAINE-NACL 0.5-0.125-0.9 MG/250ML-% EP SOLN
12.0000 mL/h | EPIDURAL | Status: DC | PRN
  Administered 2024-06-14: 12 mL/h via EPIDURAL
  Filled 2024-06-14: qty 250

## 2024-06-14 MED ORDER — IBUPROFEN 600 MG PO TABS
600.0000 mg | ORAL_TABLET | Freq: Four times a day (QID) | ORAL | Status: DC
  Administered 2024-06-14 – 2024-06-15 (×4): 600 mg via ORAL
  Filled 2024-06-14 (×4): qty 1

## 2024-06-14 MED ORDER — OXYCODONE HCL 5 MG PO TABS
5.0000 mg | ORAL_TABLET | ORAL | Status: DC | PRN
  Administered 2024-06-15 (×2): 5 mg via ORAL
  Filled 2024-06-14 (×2): qty 1

## 2024-06-14 MED ORDER — ONDANSETRON HCL 4 MG PO TABS: 4.0000 mg | ORAL_TABLET | ORAL | Status: DC | PRN

## 2024-06-14 MED ORDER — BENZOCAINE-MENTHOL 20-0.5 % EX AERO
1.0000 | INHALATION_SPRAY | CUTANEOUS | Status: DC | PRN
Start: 2024-06-14 — End: 2024-06-15
  Administered 2024-06-14: 1 via TOPICAL
  Filled 2024-06-14: qty 56

## 2024-06-14 MED ORDER — TETANUS-DIPHTH-ACELL PERTUSSIS 5-2.5-18.5 LF-MCG/0.5 IM SUSY: 0.5000 mL | PREFILLED_SYRINGE | Freq: Once | INTRAMUSCULAR | Status: DC

## 2024-06-14 MED ORDER — METHYLERGONOVINE MALEATE 0.2 MG/ML IJ SOLN: 0.2000 mg | INTRAMUSCULAR | Status: DC | PRN

## 2024-06-14 NOTE — H&P (Signed)
 Natasha Koch is a 28 y.o. female G63P2002 @ [redacted]w[redacted]d presenting for contractions since this morning, getting stronger this evening. Reports some bloody show. Unsure if leaking fluid. Good FM.  In MAU, progressed from 4/thick/ballotable at 0055 to 7/80/-2 with bulging bag of water at 0200  Pregnancy complicated by:  BMI 35: growth US  at 33 weeks  2177 gm (4#13) 49% Depression: on zoloft  prior to pregnancy, stopped with + UPT, stable Umbilical hernia: plans to see general surgery postpartum  OB History     Gravida  3   Para  2   Term  2   Preterm      AB      Living  2      SAB      IAB      Ectopic      Multiple  0   Live Births  2          Past Medical History:  Diagnosis Date   Anxiety    Depression    History of hiatal hernia    Medical history non-contributory    Past Surgical History:  Procedure Laterality Date   CHOLECYSTECTOMY     Family History: family history includes Diabetes in her father, maternal grandfather, maternal grandmother, paternal grandfather, and paternal grandmother. Social History:  reports that she has never smoked. She has never used smokeless tobacco. She reports that she does not currently use alcohol. She reports that she does not use drugs.     Maternal Diabetes: No Genetic Screening: Normal Maternal Ultrasounds/Referrals: Normal Fetal Ultrasounds or other Referrals:  None Maternal Substance Abuse:  No Significant Maternal Medications:  None Significant Maternal Lab Results:  Group B Strep negative Number of Prenatal Visits:greater than 3 verified prenatal visits Maternal Vaccinations:TDap Other Comments:  None  Review of Systems as noted in HPI History Dilation: 7 Effacement (%): 80 Station: -2 Exam by:: Natasha Lukes, NP Blood pressure 129/83, pulse 85, temperature 98.3 F (36.8 C), temperature source Oral, resp. rate 18, height 4' 11 (1.499 m), weight 87.5 kg, SpO2 99%, unknown if currently  breastfeeding. Exam Physical Exam   Gen: well appearing CVS: normal pulses Lungs: nonlabored respirations Abd: gravid Ext: no calf edema or tenderness  NST: 135bpm, mod variability, + accels, no decels Toco: ctx q 2-3 mins  Prenatal labs: ABO, Rh: --/--/PENDING (07/26 9786) Antibody: PENDING (07/26 9786) Rubella: Immune (01/02 0000) RPR: Nonreactive (01/02 0000)  HBsAg: Negative (01/02 0000)  HIV: Non-reactive (01/02 0000)  GBS: Negative/-- (06/25 0000)   Assessment/Plan: 76B [id number] @ [redacted]w[redacted]d, labor Admit to L&D Fetal wellbeing: cat I tracing Labor: expectant management, anticipate SVD Pain control: Patient desires epidural. There is currently no room/RN available on L&D to take patient. Discussed with patient, could proceed with AROM to expedite delivery vs. expectantly manage in hopes that room/RN will become available so patient can have epidural prior to delivery. She prefers to wait for L&D room, aware she may progress and deliver prior to that point.   Natasha Koch 06/14/2024, 2:44 AM

## 2024-06-14 NOTE — Anesthesia Postprocedure Evaluation (Signed)
 Anesthesia Post Note  Patient: Natasha Koch  Procedure(s) Performed: AN AD HOC LABOR EPIDURAL     Patient location during evaluation: Mother Baby Anesthesia Type: Epidural Level of consciousness: awake and alert and oriented Pain management: satisfactory to patient Vital Signs Assessment: post-procedure vital signs reviewed and stable Respiratory status: respiratory function stable Cardiovascular status: stable Postop Assessment: no headache, no backache, epidural receding, patient able to bend at knees, no signs of nausea or vomiting, adequate PO intake and able to ambulate Anesthetic complications: no   No notable events documented.  Last Vitals:  Vitals:   06/14/24 1000 06/14/24 1100  BP: 110/66 111/72  Pulse: 88 80  Resp: 16 16  Temp: 36.8 C 36.8 C  SpO2:      Last Pain:  Vitals:   06/14/24 1311  TempSrc:   PainSc: 2    Pain Goal: Patients Stated Pain Goal: 0 (06/14/24 0728)                 PURNELL PORTAL

## 2024-06-14 NOTE — Plan of Care (Signed)
  Problem: Education: Goal: Knowledge of Childbirth will improve Outcome: Progressing Goal: Ability to make informed decisions regarding treatment and plan of care will improve Outcome: Progressing Goal: Ability to state and carry out methods to decrease the pain will improve Outcome: Progressing Goal: Individualized Educational Video(s) Outcome: Progressing   Problem: Coping: Goal: Ability to verbalize concerns and feelings about labor and delivery will improve Outcome: Progressing   Problem: Life Cycle: Goal: Ability to make normal progression through stages of labor will improve Outcome: Progressing Goal: Ability to effectively push during vaginal delivery will improve Outcome: Progressing   Problem: Role Relationship: Goal: Will demonstrate positive interactions with the child Outcome: Progressing   Problem: Safety: Goal: Risk of complications during labor and delivery will decrease Outcome: Progressing   Problem: Pain Management: Goal: Relief or control of pain from uterine contractions will improve Outcome: Progressing   Problem: Education: Goal: Knowledge of General Education information will improve Description: Including pain rating scale, medication(s)/side effects and non-pharmacologic comfort measures Outcome: Progressing   Problem: Health Behavior/Discharge Planning: Goal: Ability to manage health-related needs will improve Outcome: Progressing   Problem: Clinical Measurements: Goal: Ability to maintain clinical measurements within normal limits will improve Outcome: Progressing Goal: Will remain free from infection Outcome: Progressing Goal: Diagnostic test results will improve Outcome: Progressing Goal: Respiratory complications will improve Outcome: Progressing Goal: Cardiovascular complication will be avoided Outcome: Progressing   Problem: Activity: Goal: Risk for activity intolerance will decrease Outcome: Progressing   Problem:  Nutrition: Goal: Adequate nutrition will be maintained Outcome: Progressing   Problem: Coping: Goal: Level of anxiety will decrease Outcome: Progressing   Problem: Elimination: Goal: Will not experience complications related to bowel motility Outcome: Progressing Goal: Will not experience complications related to urinary retention Outcome: Progressing   Problem: Pain Managment: Goal: General experience of comfort will improve and/or be controlled Outcome: Progressing   Problem: Safety: Goal: Ability to remain free from injury will improve Outcome: Progressing   Problem: Skin Integrity: Goal: Risk for impaired skin integrity will decrease Outcome: Progressing

## 2024-06-14 NOTE — MAU Note (Signed)
 Natasha Koch is a 28 y.o. at [redacted]w[redacted]d here in MAU reporting: ctx since this morning - irregular, but got worse around 1800 - now every 5 minutes. Reports losing her mucous plug throughout the day with some bloody show. Reports every time she has a ctx for the past hour and a half she's had some leaking.   LMP: NA Onset of complaint: 1800 Pain score: 9 Vitals:   06/14/24 0034  BP: 119/81  Pulse: 84  Resp: 19  Temp: 98.6 F (37 C)  SpO2: 99%     FHT: 154  Lab orders placed from triage: labor eval

## 2024-06-14 NOTE — Anesthesia Preprocedure Evaluation (Signed)
Anesthesia Evaluation  Patient identified by MRN, date of birth, ID band Patient awake    Reviewed: Allergy & Precautions, Patient's Chart, lab work & pertinent test results  History of Anesthesia Complications Negative for: history of anesthetic complications  Airway Mallampati: II  TM Distance: >3 FB Neck ROM: Full    Dental no notable dental hx.    Pulmonary neg pulmonary ROS   Pulmonary exam normal        Cardiovascular negative cardio ROS Normal cardiovascular exam     Neuro/Psych   Anxiety Depression    negative neurological ROS     GI/Hepatic negative GI ROS, Neg liver ROS,,,  Endo/Other  negative endocrine ROS    Renal/GU negative Renal ROS  negative genitourinary   Musculoskeletal negative musculoskeletal ROS (+)    Abdominal   Peds  Hematology negative hematology ROS (+)   Anesthesia Other Findings Day of surgery medications reviewed with patient.  Reproductive/Obstetrics (+) Pregnancy                              Anesthesia Physical Anesthesia Plan  ASA: 2  Anesthesia Plan: Epidural   Post-op Pain Management:    Induction:   PONV Risk Score and Plan: Treatment may vary due to age or medical condition  Airway Management Planned: Natural Airway  Additional Equipment: Fetal Monitoring  Intra-op Plan:   Post-operative Plan:   Informed Consent: I have reviewed the patients History and Physical, chart, labs and discussed the procedure including the risks, benefits and alternatives for the proposed anesthesia with the patient or authorized representative who has indicated his/her understanding and acceptance.       Plan Discussed with:   Anesthesia Plan Comments:          Anesthesia Quick Evaluation  

## 2024-06-14 NOTE — Progress Notes (Signed)
 OB Progress Note  S: Pt is now comfortable with epidural   O: Today's Vitals   06/14/24 0415 06/14/24 0430 06/14/24 0445 06/14/24 0500  BP: 121/66 120/69 135/79 139/82  Pulse: 87 80 82 90  Resp:      Temp:      TempSrc:      SpO2: 100% 100% 100% 99%  Weight:      Height:      PainSc:       Body mass index is 38.98 kg/m.  SVE 8/100/0, AROM clear fluid  FHR: 135bpm, mod variability, + accels, no decels Toco: ctx q 2-4 mins   A/P: 27Y G3P2002 @ [redacted]w[redacted]d, labor Fetal wellbeing: cat I tracing Labor: s/p AROM, anticipate SVD Pain control: epidural M.Abdulrahman Bracey MD 06/14/24 5:06 AM

## 2024-06-14 NOTE — Lactation Note (Signed)
 This note was copied from a baby's chart. Lactation Consultation Note  Patient Name: Natasha Koch Unijb'd Date: 06/14/2024 Age:26 hours Reason for consult: Initial assessment;Term  P3- [name] reports that she plans to offer both breast milk and formula because she has never been able to produce enough milk for her children. [name] reports that infant latches really well so far and denies experiencing any pain or pinching. Infant had recently fed, so LC was unable to assess a latch. LC encouraged [name] to call for a latch assessment before discharge. [name] denies having any questions or concerns at this time. LC reviewed how she should always offer the breast first, then formula as needed. LC reviewed the first 24 hr birthday nap, day 2 cluster feeding, feeding infant on cue 8-12x in 24 hrs, not allowing infant to go over 3 hrs without a feeding, CDC milk storage guidelines, LC services handout and engorgement/breast care. LC encouraged [name] to call for further assistance as needed.  Maternal Data Does the patient have breastfeeding experience prior to this delivery?: Yes How long did the patient breastfeed?: 3 months with first child and 1 year with second child  Feeding Mother's Current Feeding Choice: Breast Milk and Formula  Lactation Tools Discussed/Used Tools: Pump Breast pump type: Manual Pump Education: Setup, frequency, and cleaning;Milk Storage Reason for Pumping: [name] request Pumping frequency: 15-20 min every 3 hrs  Interventions Interventions: Breast feeding basics reviewed;Hand pump;Education;LC Services brochure  Discharge Discharge Education: Engorgement and breast care;Warning signs for feeding baby Pump: Manual;Hands Free;Personal  Consult Status Consult Status: Follow-up Date: 06/15/24 Follow-up type: In-patient    Recardo Hoit BS, IBCLC 06/14/2024, 1:32 PM

## 2024-06-14 NOTE — Anesthesia Procedure Notes (Signed)
 Epidural Patient location during procedure: OB Start time: 06/14/2024 3:25 AM End time: 06/14/2024 3:28 AM  Staffing Anesthesiologist: Paul Lamarr BRAVO, MD Performed: anesthesiologist   Preanesthetic Checklist Completed: patient identified, IV checked, risks and benefits discussed, monitors and equipment checked, pre-op evaluation and timeout performed  Epidural Patient position: sitting Prep: DuraPrep and site prepped and draped Patient monitoring: continuous pulse ox, blood pressure and heart rate Approach: midline Location: L3-L4 Injection technique: LOR air  Needle:  Needle type: Tuohy  Needle gauge: 17 G Needle length: 9 cm Needle insertion depth: 5 cm Catheter type: closed end flexible Catheter size: 19 Gauge Catheter at skin depth: 10 cm Test dose: negative and Other (1% lidocaine )  Assessment Events: blood not aspirated, no cerebrospinal fluid, injection not painful, no injection resistance, no paresthesia and negative IV test  Additional Notes Patient identified. Risks, benefits, and alternatives discussed with patient including but not limited to bleeding, infection, nerve damage, paralysis, failed block, incomplete pain control, headache, blood pressure changes, nausea, vomiting, reactions to medication, itching, and postpartum back pain. Confirmed with bedside nurse the patient's most recent platelet count. Confirmed with patient that they are not currently taking any anticoagulation, have any bleeding history, or any family history of bleeding disorders. Patient expressed understanding and wished to proceed. All questions were answered. Sterile technique was used throughout the entire procedure. Please see nursing notes for vital signs.   Crisp LOR on first pass. Test dose was given through epidural catheter and negative prior to continuing to dose epidural or start infusion. Warning signs of high block given to the patient including shortness of breath,  tingling/numbness in hands, complete motor block, or any concerning symptoms with instructions to call for help. Patient was given instructions on fall risk and not to get out of bed. All questions and concerns addressed with instructions to call with any issues or inadequate analgesia.  Reason for block:procedure for pain

## 2024-06-15 LAB — CBC
HCT: 29.6 % — ABNORMAL LOW (ref 36.0–46.0)
Hemoglobin: 10.1 g/dL — ABNORMAL LOW (ref 12.0–15.0)
MCH: 28 pg (ref 26.0–34.0)
MCHC: 34.1 g/dL (ref 30.0–36.0)
MCV: 82 fL (ref 80.0–100.0)
Platelets: 259 K/uL (ref 150–400)
RBC: 3.61 MIL/uL — ABNORMAL LOW (ref 3.87–5.11)
RDW: 14.6 % (ref 11.5–15.5)
WBC: 13.3 K/uL — ABNORMAL HIGH (ref 4.0–10.5)
nRBC: 0 % (ref 0.0–0.2)

## 2024-06-15 LAB — BIRTH TISSUE RECOVERY COLLECTION (PLACENTA DONATION)

## 2024-06-15 MED ORDER — IBUPROFEN 600 MG PO TABS: 600.0000 mg | ORAL_TABLET | Freq: Four times a day (QID) | ORAL | 0 refills | Status: AC | PRN

## 2024-06-15 NOTE — Discharge Summary (Signed)
 Postpartum Discharge Summary       Patient Name: Natasha Koch DOB: 11-29-1995 MRN: [id number]  Date of admission: 06/14/2024 Delivery date:06/14/2024 Delivering provider: OKEY LEADER Date of discharge: 06/15/2024  Admitting diagnosis: Uterine contractions [O47.9] Spontaneous vaginal delivery [O80] Intrauterine pregnancy: [redacted]w[redacted]d     Secondary diagnosis:  Principal Problem:   Uterine contractions Active Problems:   Spontaneous vaginal delivery     Discharge diagnosis: Term Pregnancy Delivered                                              Post partum procedures:Not applicable Augmentation: Pitocin  Complications: None  Hospital course: Onset of Labor With Vaginal Delivery      28 y.o. yo [id number] at [redacted]w[redacted]d was admitted in Active Labor on 06/14/2024. Labor course was complicated by nothing Membrane Rupture Time/Date: 4:59 AM,06/14/2024  Delivery Method:Vaginal, Spontaneous Operative Delivery:N/A Episiotomy: None Lacerations:  Labial Patient had a postpartum course complicated by nothing.  She is ambulating, tolerating a regular diet, passing flatus, and urinating well. Patient is discharged home in stable condition on 06/15/24.  Newborn Data: Birth date:06/14/2024 Birth time:8:02 AM Gender:Female Living status:Living Apgars:8 ,9  Weight:3680 g  Magnesium  Sulfate received: No BMZ received: No Rhophylac:N/A  Immunizations administered: There is no immunization history for the selected administration types on file for this patient.  Physical exam  Vitals:   06/14/24 1455 06/14/24 1858 06/14/24 2251 06/15/24 0326  BP: (!) 104/57 124/71 (!) 114/56 108/60  Pulse: 77 82 94 91  Resp: 18 18 17 18   Temp: 98.2 F (36.8 C) 98 F (36.7 C) 98.4 F (36.9 C) 97.8 F (36.6 C)  TempSrc: Oral Oral Oral Oral  SpO2:  99% 99% 100%  Weight:      Height:       General: alert, cooperative, and no distress Lochia: appropriate Uterine Fundus: firm DVT Evaluation: No evidence of DVT  seen on physical exam. Labs: Lab Results  Component Value Date   WBC 13.3 (H) 06/15/2024   HGB 10.1 (L) 06/15/2024   HCT 29.6 (L) 06/15/2024   MCV 82.0 06/15/2024   PLT 259 06/15/2024      Latest Ref Rng & Units 11/27/2023    6:27 PM  CMP  Glucose 70 - 99 mg/dL 894   BUN 6 - 20 mg/dL <5   Creatinine 9.55 - 1.00 mg/dL 9.54   Sodium 864 - 854 mmol/L 135   Potassium 3.5 - 5.1 mmol/L 3.9   Chloride 98 - 111 mmol/L 104   CO2 22 - 32 mmol/L 22   Calcium 8.9 - 10.3 mg/dL 9.2    Edinburgh Score:    06/14/2024   10:50 PM  Edinburgh Postnatal Depression Scale Screening Tool  I have been able to laugh and see the funny side of things. 0  I have looked forward with enjoyment to things. 0  I have blamed myself unnecessarily when things went wrong. 1  I have been anxious or worried for no good reason. 1  I have felt scared or panicky for no good reason. 0  Things have been getting on top of me. 0  I have been so unhappy that I have had difficulty sleeping. 0  I have felt sad or miserable. 0  I have been so unhappy that I have been crying. 1  The thought of harming myself has  occurred to me. 0  Edinburgh Postnatal Depression Scale Total 3      After visit meds:  Allergies as of 06/15/2024   No Known Allergies      Medication List     STOP taking these medications    azelastine 0.1 % nasal spray Commonly known as: ASTELIN   dicyclomine  20 MG tablet Commonly known as: BENTYL    fluticasone  50 MCG/ACT nasal spray Commonly known as: FLONASE    guaiFENesin  600 MG 12 hr tablet Commonly known as: Mucinex    LORazepam  0.5 MG tablet Commonly known as: ATIVAN    ondansetron  4 MG disintegrating tablet Commonly known as: ZOFRAN -ODT   promethazine  25 MG tablet Commonly known as: PHENERGAN    sertraline  50 MG tablet Commonly known as: ZOLOFT    traZODone  50 MG tablet Commonly known as: DESYREL        TAKE these medications    ibuprofen  600 MG tablet Commonly known as:  ADVIL  Take 1 tablet (600 mg total) by mouth every 6 (six) hours as needed.   prenatal multivitamin Tabs tablet Take 1 tablet by mouth daily at 12 noon.               Discharge Care Instructions  (From admission, onward)           Start     Ordered   06/15/24 0000  Discharge wound care:       Comments: For a cesarean delivery: You may wash incision with soap and water.  Do not soak or submerge the incision for 2 weeks. Keep incision dry. You may need to keep a sanitary pad or panty liner between the incision and your clothing for comfort and to keep the incision dry. If you note drainage, increased pain, or increased redness of the incision, then please notify your physician.   06/15/24 1007   06/15/24 0000  If the dressing is still on your incision site when you go home, remove it on the third day after your surgery date. Remove dressing if it begins to fall off, or if it is dirty or damaged before the third day.       Comments: For a cesarean delivery   06/15/24 1007             Discharge home in stable condition Infant Feeding: Breast Infant Disposition:home with mother Discharge instruction: per After Visit Summary and Postpartum booklet. Activity: Advance as tolerated. Pelvic rest for 6 weeks.  Diet: routine diet Anticipated Birth Control: Unsure Postpartum Appointment:4 weeks Future Appointments:No future appointments. Follow up Visit:  Follow-up Information     Okey Leader, MD Follow up in 4 week(s).   Specialty: Obstetrics and Gynecology Why: For postpartum evaluation Contact information: [address] [place] [address] [phone]                     06/15/2024 Leader Okey, MD

## 2024-06-15 NOTE — Progress Notes (Addendum)
 [name] was referred for history of depression. * Referral screened out by Clinical Social Worker because none of the following criteria appear to apply: ~ History of anxiety/depression during this pregnancy, or of post-partum depression following prior delivery. ~ Diagnosis of anxiety and/or depression within last 3 years OR * [name]'s symptoms currently being treated with medication and/or therapy. [name] has an active prescription for Zoloft but stopped taking with +UPT. Per OBGYN records, [name] sees a Veterinary surgeon. Edinburgh score of 3.   Please contact the Clinical Social Worker if needs arise, by [facility] request, or if [name] scores greater than 9/yes to question 10 on Edinburgh Postpartum Depression Screen.  Signed,  Sharyne LOIS Roulette, MSW, [place], [place] 14-Aug-2024 12:33 PM

## 2024-06-15 NOTE — Lactation Note (Signed)
 This note was copied from a baby's chart. Lactation Consultation Note  Patient Name: Natasha Koch Date: 06/15/2024 Age:26 hours Reason for consult: Maternal discharge;Term (Infant with weight loss -0.96%).  Since 100 am infant had 2 stools and one void diaper.  Per [name], she has latch infant first every feeding she is not formula feeding only, flowsheet did not reflect [name] actually feedings. Infant given 11 mls of formula was bottle feeding as LC walked in room. [name] would like help with latch on her right breast  due to pain and infant is latching well on the left most feedings are 15 to 20 minutes in length. LC gave pillow support and [name] latched infant on her right breast using the cross cradle hold, with pillow support, swallows were heard,  infant sustained his latch, Per [name] she did not have pain or discomfort with current latch,  infant briefly breastfeed for 4 minutes due to receiving formula prior to latch. LC reviewed hand expression and [name] easily expressed 5 mls of colostrum that was spoon fed to infant.   LC reviewed on Day 2 of life infant will cluster feed and this is normal infant behavior. [name] will latch infant first every feeding, can offer both breast during the feeding, afterwards she can give infant her own EBM before offering formula by hand expression or using her Personal DEBP or hand pump to help stimulate and establish her milk supply due to her previous concerns of having a low milk supply. [name] has handout Supplementing with Breastfeeding. Day to if she chose to supplement she knows to offer infant after latching infant at the breast  7-12 mls EBM/ Formula.  Current feeding plan -Day 2 of life 1- Continue to latch infant first every feeding by cues, on demand, 8-12 times within 24 hours, skin to skin. 2- If infant is still cuing after offer the 1st breast then offer the 2nd breast during the same feeding. Offer formula at her own discretion. LC suggested  supplement with her own EBM first before formula.  3- LC discussed the importance of maternal rest, meals and hydration.   LC discharge education: 1- LC discussed engorgement treatment and prevention. 2- LC discussed warning signs of dehydration in infant, infant's input and output within the first week of life. 3- How to know if breastfeeding is going well. 4- [name] has Community resources for breastfeeding after hospital discharge: LC hotline, LC breastfeeding support group and [facility] outpatient  clinic.  Maternal Data    Feeding Mother's Current Feeding Choice: Breast Milk and Formula  LATCH Score Latch: Grasps breast easily, tongue down, lips flanged, rhythmical sucking.  Audible Swallowing: Spontaneous and intermittent  Type of Nipple: Everted at rest and after stimulation  Comfort (Breast/Nipple): Soft / non-tender  Hold (Positioning): Assistance needed to correctly position infant at breast and maintain latch.  LATCH Score: 9   Lactation Tools Discussed/Used    Interventions Interventions: Assisted with latch;Skin to skin;Adjust position;Support pillows;Breast compression;Hand express;Education;Guidelines for Milk Supply and Pumping Schedule Handout;LC Services brochure;CDC milk storage guidelines;CDC Guidelines for Breast Pump Cleaning  Discharge Discharge Education: Engorgement and breast care;Warning signs for feeding baby Pump: DEBP;Manual;Personal  Consult Status Consult Status: Complete Date: 06/15/24 Follow-up type: Physician    Natasha Koch 06/15/2024, 2:00 PM

## 2024-06-17 ENCOUNTER — Inpatient Hospital Stay (HOSPITAL_COMMUNITY): Admission: AD | Admit: 2024-06-17 | Source: Home / Self Care | Admitting: Obstetrics and Gynecology

## 2024-06-17 ENCOUNTER — Inpatient Hospital Stay (HOSPITAL_COMMUNITY)

## 2024-07-02 ENCOUNTER — Telehealth (HOSPITAL_COMMUNITY): Payer: Self-pay | Admitting: *Deleted

## 2024-07-02 NOTE — Telephone Encounter (Signed)
 07/02/2024  Name: Natasha Koch MRN: [id number] DOB: May 19, 1996  Reason for Call:  Transition of Care Hospital Discharge Call  Contact Status: Patient Contact Status: Message  Language assistant needed:          Follow-Up Questions:    [place] Postnatal Depression Scale:  In the Past 7 Days:    PHQ2-9 Depression Scale:     Discharge Follow-up:    Post-discharge interventions: NA  Mliss Sieve, RN 07/02/2024 10:04

## 2024-10-31 ENCOUNTER — Other Ambulatory Visit: Payer: Self-pay

## 2024-10-31 ENCOUNTER — Emergency Department (HOSPITAL_COMMUNITY)
Admission: EM | Admit: 2024-10-31 | Discharge: 2024-10-31 | Disposition: A | Discharge status: Home / Self Care | Attending: Emergency Medicine | Admitting: Emergency Medicine

## 2024-10-31 ENCOUNTER — Encounter (HOSPITAL_COMMUNITY): Payer: Self-pay

## 2024-10-31 ENCOUNTER — Emergency Department (HOSPITAL_COMMUNITY)

## 2024-10-31 DIAGNOSIS — K219 Gastro-esophageal reflux disease without esophagitis: Secondary | ICD-10-CM | POA: Insufficient documentation

## 2024-10-31 DIAGNOSIS — R109 Unspecified abdominal pain: Secondary | ICD-10-CM | POA: Diagnosis present

## 2024-10-31 DIAGNOSIS — K429 Umbilical hernia without obstruction or gangrene: Secondary | ICD-10-CM

## 2024-10-31 DIAGNOSIS — R1033 Periumbilical pain: Secondary | ICD-10-CM

## 2024-10-31 LAB — URINALYSIS, ROUTINE W REFLEX MICROSCOPIC
Bilirubin Urine: NEGATIVE
Glucose, UA: NEGATIVE mg/dL
Ketones, ur: NEGATIVE mg/dL
Nitrite: NEGATIVE
Protein, ur: NEGATIVE mg/dL
Specific Gravity, Urine: 1.009 (ref 1.005–1.030)
pH: 7 (ref 5.0–8.0)

## 2024-10-31 LAB — BASIC METABOLIC PANEL WITH GFR
Anion gap: 6 (ref 5–15)
BUN: 12 mg/dL (ref 6–20)
CO2: 28 mmol/L (ref 22–32)
Calcium: 9.5 mg/dL (ref 8.9–10.3)
Chloride: 103 mmol/L (ref 98–111)
Creatinine, Ser: 0.48 mg/dL (ref 0.44–1.00)
GFR, Estimated: >60 mL/min (ref >60)
Glucose, Bld: 102 mg/dL — ABNORMAL HIGH (ref 70–99)
Potassium: 4.3 mmol/L (ref 3.5–5.1)
Sodium: 137 mmol/L (ref 135–145)

## 2024-10-31 LAB — CBC WITH DIFFERENTIAL/PLATELET
Abs Immature Granulocytes: 0.03 K/uL (ref 0.00–0.07)
Basophils Absolute: 0.1 K/uL (ref 0.0–0.1)
Basophils Relative: 1 %
Eosinophils Absolute: 0.2 K/uL (ref 0.0–0.5)
Eosinophils Relative: 2 %
HCT: 41.8 % (ref 36.0–46.0)
Hemoglobin: 14.4 g/dL (ref 12.0–15.0)
Immature Granulocytes: 0 %
Lymphocytes Relative: 27 %
Lymphs Abs: 2.8 K/uL (ref 0.7–4.0)
MCH: 29 pg (ref 26.0–34.0)
MCHC: 34.4 g/dL (ref 30.0–36.0)
MCV: 84.1 fL (ref 80.0–100.0)
Monocytes Absolute: 0.8 K/uL (ref 0.1–1.0)
Monocytes Relative: 7 %
Neutro Abs: 6.6 K/uL (ref 1.7–7.7)
Neutrophils Relative %: 63 %
Platelets: 312 K/uL (ref 150–400)
RBC: 4.97 MIL/uL (ref 3.87–5.11)
RDW: 13.1 % (ref 11.5–15.5)
WBC: 10.4 K/uL (ref 4.0–10.5)
nRBC: 0 % (ref 0.0–0.2)

## 2024-10-31 LAB — PREGNANCY, URINE: Preg Test, Ur: NEGATIVE

## 2024-10-31 MED ORDER — OMEPRAZOLE 20 MG PO CPDR: 20.0000 mg | DELAYED_RELEASE_CAPSULE | Freq: Every day | ORAL | 3 refills | Status: AC

## 2024-10-31 MED ORDER — IOHEXOL 350 MG/ML SOLN
75.0000 mL | Freq: Once | INTRAVENOUS | Status: AC | PRN
  Administered 2024-10-31: 75 mL via INTRAVENOUS

## 2024-10-31 MED ORDER — FAMOTIDINE 20 MG PO TABS: 20.0000 mg | ORAL_TABLET | Freq: Two times a day (BID) | ORAL | 0 refills | Status: AC

## 2024-10-31 MED ORDER — NAPROXEN 500 MG PO TABS: 500.0000 mg | ORAL_TABLET | Freq: Two times a day (BID) | ORAL | 0 refills | Status: AC

## 2024-10-31 MED ORDER — DIPHENHYDRAMINE HCL 25 MG PO CAPS
50.0000 mg | ORAL_CAPSULE | Freq: Once | ORAL | Status: AC
  Administered 2024-10-31: 50 mg via ORAL
  Filled 2024-10-31: qty 2

## 2024-10-31 MED ORDER — PANTOPRAZOLE SODIUM 40 MG IV SOLR
40.0000 mg | Freq: Once | INTRAVENOUS | Status: AC
  Administered 2024-10-31: 40 mg via INTRAVENOUS
  Filled 2024-10-31: qty 10

## 2024-10-31 NOTE — ED Triage Notes (Signed)
 Pt c.o pain from her umbilical hernia for the past 5 days, since yesterday she hasn't been able to reduce the hernia. Pt also c.o vomiting earlier today, pt currently nauseated.

## 2024-10-31 NOTE — ED Notes (Signed)
 PT in [place] ,will get vitals once returned to [facility]

## 2024-10-31 NOTE — ED Provider Notes (Signed)
 [place] EMERGENCY DEPARTMENT AT [facility] Provider Note   CSN: [id number] Arrival date & time: 10/31/24  1707     Patient presents with: Abdominal Pain and Hernia   Natasha Koch is a 28 y.o. female. Hx of [id number] recent delivery in July, 2025, known umbilical hernia presenting with enlarged umbilical hernia, abdominal pain.  History per patient.  Reports she was at work, feels that the hernia got much bigger, and was having nausea, sharp pain in her abdomen.  States that the hernia was hard.  While in the ED, endorses that she was pushing on the hernia, was able to reduce it, with significant improvement in pain.  Denies nausea or vomiting at this time.  She endorses that intermittently she will have acid sensation in her throat, states that she had some while she was in triage.  Endorses last BM was 2 days ago.  Endorses over the last month, has had more constipation than normal, even going a week without a BM.  Occasionally uses laxatives to help with stool passage.  Was referred to GI, she is unable to get in with an appointment for 1 to 1.5 months.  Patient of note received a workup in triage, after receiving imaging with contrast patient developed hives, was administered Benadryl , and taken directly to room.  Denies any shortness of breath, throat itching, lip swelling, tongue swelling, throat swelling, diarrhea, nausea, vomiting at this time.  Denies wheezing as well.  Denies fever or chills.    Abdominal Pain      Prior to Admission medications  Medication Sig Start Date End Date Taking? Authorizing Provider  famotidine  (PEPCID ) 20 MG tablet Take 1 tablet (20 mg total) by mouth 2 (two) times daily. 10/31/24  Yes Arlee Katz, MD  naproxen  (NAPROSYN ) 500 MG tablet Take 1 tablet (500 mg total) by mouth 2 (two) times daily. 10/31/24  Yes Arlee Katz, MD  omeprazole  (PRILOSEC) 20 MG capsule Take 1 capsule (20 mg total) by mouth daily. 10/31/24  Yes Arlee Katz, MD  ibuprofen  (ADVIL ) 600 MG tablet Take 1 tablet (600 mg total) by mouth every 6 (six) hours as needed. 06/15/24   Okey Leader, MD  Prenatal Vit-Fe Fumarate-FA (PRENATAL MULTIVITAMIN) TABS tablet Take 1 tablet by mouth daily at 12 noon.    [provider]    Allergies: Ivp dye [iodinated contrast media]    Review of Systems  Gastrointestinal:  Positive for abdominal pain.    Updated Vital Signs BP 107/70 (BP Location: Right Arm)   Pulse 80   Temp 97.6 F (36.4 C) (Oral)   Resp 17   Ht 4' 11 (1.499 m)   Wt 80.7 kg   LMP 10/23/2024 (Approximate)   SpO2 100%   BMI 35.95 kg/m   Physical Exam Vitals and nursing note reviewed.  Constitutional:      General: She is not in acute distress.    Appearance: She is well-developed.  HENT:     Head: Normocephalic and atraumatic.  Eyes:     Conjunctiva/sclera: Conjunctivae normal.  Cardiovascular:     Rate and Rhythm: Normal rate and regular rhythm.     Heart sounds: No murmur heard. Pulmonary:     Effort: Pulmonary effort is normal. No respiratory distress.     Breath sounds: Normal breath sounds.  Abdominal:     General: Abdomen is flat. There is no distension.     Palpations: Abdomen is soft.     Tenderness: There is abdominal  tenderness. There is no right CVA tenderness, left CVA tenderness, guarding or rebound. Negative signs include Murphy's sign and Rovsing's sign.     Comments: Mild TTP in umbilical region.  There is expression of fat-containing hernia present at the umbilicus, very small at this time.  Tender to palpation with significant compression, easily reducible hernia.  Nontender to palpation with gentle evaluation.  Musculoskeletal:        General: No swelling.     Cervical back: Neck supple.  Skin:    General: Skin is warm and dry.     Capillary Refill: Capillary refill takes less than 2 seconds.  Neurological:     General: No focal deficit present.     Mental Status: She is alert and  oriented to person, place, and time.  Psychiatric:        Mood and Affect: Mood normal.     (all labs ordered are listed, but only abnormal results are displayed) Labs Reviewed  BASIC METABOLIC PANEL WITH GFR - Abnormal; Notable for the following components:      Result Value   Glucose, Bld 102 (*)    All other components within normal limits  URINALYSIS, ROUTINE W REFLEX MICROSCOPIC - Abnormal; Notable for the following components:   Color, Urine STRAW (*)    Hgb urine dipstick MODERATE (*)    Leukocytes,Ua TRACE (*)    Bacteria, UA RARE (*)    All other components within normal limits  CBC WITH DIFFERENTIAL/PLATELET  PREGNANCY, URINE    EKG: None  Radiology: CT ABDOMEN PELVIS W CONTRAST Result Date: 10/31/2024 CLINICAL DATA:  Abdominal pain EXAM: CT ABDOMEN AND PELVIS WITH CONTRAST TECHNIQUE: Multidetector CT imaging of the abdomen and pelvis was performed using the standard protocol following bolus administration of intravenous contrast. RADIATION DOSE REDUCTION: This exam was performed according to the departmental dose-optimization program which includes automated exposure control, adjustment of the mA and/or kV according to patient size and/or use of iterative reconstruction technique. CONTRAST:  75mL OMNIPAQUE  IOHEXOL  350 MG/ML SOLN COMPARISON:  CT 10/04/2022 FINDINGS: Lower chest: No acute abnormality. Hepatobiliary: Hepatic steatosis. Probable fat sparing near the gallbladder fossa. Cholecystectomy. No biliary dilatation Pancreas: Unremarkable. No pancreatic ductal dilatation or surrounding inflammatory changes. Spleen: Normal in size without focal abnormality. Adrenals/Urinary Tract: Adrenal glands are unremarkable. Kidneys are normal, without renal calculi, focal lesion, or hydronephrosis. Bladder is unremarkable. Stomach/Bowel: Stomach is within normal limits. Appendix appears normal. No evidence of bowel wall thickening, distention, or inflammatory changes.  Vascular/Lymphatic: No significant vascular findings are present. No enlarged abdominal or pelvic lymph nodes. Reproductive: No suspicious adnexal mass.  IUD in the uterus Other: Negative for ascites or free air. Increased periumbilical hernia. The hernia contains both fat and a short segment of small bowel. No signs for obstruction on the current exam. Musculoskeletal: No acute osseous abnormality. IMPRESSION: 1. No CT evidence for acute intra-abdominal or pelvic abnormality. 2. Increased periumbilical hernia containing both fat and a short segment of small bowel. No signs for obstruction on the current exam. 3. Hepatic steatosis. Electronically Signed   By: Luke Bun M.D.   On: 10/31/2024 20:42     Procedures   Medications Ordered in the ED  iohexol  (OMNIPAQUE ) 350 MG/ML injection 75 mL (75 mLs Intravenous Contrast Given 10/31/24 2026)  diphenhydrAMINE  (BENADRYL ) capsule 50 mg (50 mg Oral Given 10/31/24 2053)  pantoprazole  (PROTONIX ) injection 40 mg (40 mg Intravenous Given 10/31/24 2111)    Clinical Course as of 10/31/24 2359  Fri Oct 31, 2024  2057 Patient noted to have hives and rash after receiving contrast.  Denied shortness of breath/trouble breathing, GI symptoms, or trouble swallowing.  Given Benadryl . [EA]    Clinical Course User Index [EA] Rosina Almarie LABOR, PA-C                                 Medical Decision Making Risk Prescription drug management.   Based on patient presentation, history, evaluation, high suspicion for umbilical hernia, easily reducible in the ED, as well as allergic reaction to contrast, however localized only to hives on the skin, provided Benadryl  with significant improvement.  Patient also with GERD like symptoms in the ED, provided Protonix  with complete resolution of symptoms.  Monitored in the ED for 2 hours after initially receiving Benadryl , with improvement in hives, and remaining asymptomatic.  Hernia repair has remained reduced while in  the ED, and patient has remained symptomatic, no nausea or vomiting, and tolerating p.o. intake well.  Patient already has an abdominal binder at home, I discussed recommendation of how to use the abdominal binder, as well as providing towels to localize compression of the umbilical hernia and maintain internally.  Patient was previously referred to GI, however provided referral to general surgery to discuss umbilical hernia repair as this will likely require a surgical intervention, particularly with recent C-section.  Overall, with reassuring workup, reduced hernia at this time, and pain resolved, patient is overall stable for discharge.  Provided prescription for home for GERD, as well as pain management, and referral to general surgery service.  Strict return precautions discussed, recommend follow-up with PCP as well in 3 to 4 days.     Final diagnoses:  Umbilical hernia without obstruction and without gangrene  Periumbilical abdominal pain  Gastroesophageal reflux disease, unspecified whether esophagitis present    ED Discharge Orders          Ordered    Ambulatory referral to General Surgery        10/31/24 2228    naproxen  (NAPROSYN ) 500 MG tablet  2 times daily        10/31/24 2228    famotidine  (PEPCID ) 20 MG tablet  2 times daily        10/31/24 2229    omeprazole  (PRILOSEC) 20 MG capsule  Daily        10/31/24 2229               Arlee Katz, MD 10/31/24 2359

## 2024-10-31 NOTE — ED Triage Notes (Signed)
 Pt c/o hernia and nausea. Pt has had hernia since 2016. Pt was at work and started having sharp pain in her abdomen with nausea, states the hernia feels hard.

## 2024-10-31 NOTE — ED Provider Triage Note (Signed)
 Emergency Medicine Provider Triage Evaluation Note  Natasha Koch , a 28 y.o. female  was evaluated in triage.  Pt complains of worsening of umbilical hernia over the last month.  She reports earlier today while at work her umbilical hernia felt hard, distended, and painful with associated nausea.  She traveled here from [place] and rode laying down in the car so her hernia has since reduced to normal.  She reports over the last month the hernia has been getting progressively larger.  Last bowel movement was 2 days ago.  Over the last month she has had more constipation than normal often going a week without bowel movement.  She occasionally uses over-the-counter stool softener and laxatives.  She denies current nausea and minimal abdominal pain.  She spoke with her OB/GYN about recent symptoms and was referred to GI.  She is unable to get in with a GI specialist for 1 to 1.5 months.  Review of Systems  Positive: Umbilical hernia, abdominal pain, constipation Negative: Current nausea, fevers  Physical Exam  BP 122/74 (BP Location: Right Arm)   Pulse 93   Temp 97.6 F (36.4 C)   Resp 18   Ht 4' 11 (1.499 m)   Wt 80.7 kg   LMP 10/23/2024 (Approximate)   SpO2 98%   BMI 35.95 kg/m  Gen:   Awake, no distress   Resp:  Normal effort  MSK:   Moves extremities without difficulty  Other:  Abdomen is soft with minimal tenderness over the umbilical region.  Medical Decision Making  Medically screening exam initiated at 6:57 PM.  Appropriate orders placed.  Burnard JAYSON Fail was informed that the remainder of the evaluation will be completed by another provider, this initial triage assessment does not replace that evaluation, and the importance of remaining in the ED until their evaluation is complete.   Rosina Almarie LABOR, PA-C 10/31/24 1900

## 2024-10-31 NOTE — ED Notes (Signed)
 This nurse attempted Iv access twice

## 2024-11-04 ENCOUNTER — Ambulatory Visit: Payer: Self-pay | Admitting: General Surgery

## 2024-11-04 NOTE — H&P (Signed)
 Chief Complaint: No chief complaint on file.     History of Present Illness: Natasha Koch is a 28 y.o. female who is seen today as an office consultation at the request of Dr. Gretta for evaluation of No chief complaint on file. Natasha Koch   History of Present Illness Natasha Koch is a 28 year old female with longstanding umbilical hernia and diastasis recti who presents for surgical evaluation of worsening hernia symptoms with bowel involvement.  She developed an umbilical hernia after her first pregnancy with progressive worsening. The hernia now protrudes with bending or sitting and requires manual reduction by lying down and applying pressure. Since last Friday she had an episode of severe periumbilical pain with associated nausea, dizziness, hot flashes, and persistent headache. The hernia was only partially reducible at that time, and she required prolonged attempts at reduction.  Concerned for possible strangulation, she went to the ER where the hernia was ultimately reduced. Since then she has had recurrent nausea, dizziness, headache, and activity-limiting periumbilical pain. Sitting or bending at work triggers hernia protrusion and pain that interferes with her job duties.  She previously tried physical therapy because she was afraid of surgery, but her symptoms and bulge worsened, especially after two subsequent pregnancies. She was told she has a wide diastasis, and symptoms have been most severe since her third pregnancy.  She had a laparoscopic cholecystectomy in 2016 that was complicated by postoperative pain thought to be due to retained air and a possible superficial wound infection that required antibiotics. She has had no other abdominal surgeries, takes no daily medications, and was given short courses of acid-suppressing medications in the ER. She has no diabetes, hypertension, or cardiac disease.        Review of Systems: A complete review of systems was obtained from  the patient.  I have reviewed this information and discussed as appropriate with the patient.  See HPI as well for other ROS.  Review of Systems  Constitutional:  Negative for fever.  HENT:  Negative for congestion.   Eyes:  Negative for blurred vision.  Respiratory:  Negative for cough, shortness of breath and wheezing.   Cardiovascular:  Negative for chest pain and palpitations.  Gastrointestinal:  Negative for heartburn.  Genitourinary:  Negative for dysuria.  Musculoskeletal:  Negative for myalgias.  Skin:  Negative for rash.  Neurological:  Negative for dizziness and headaches.  Psychiatric/Behavioral:  Negative for depression and suicidal ideas.   All other systems reviewed and are negative.     Medical History: Past Medical History: Diagnosis Date  Anxiety   Depression   History of hiatal hernia    There is no problem list on file for this patient.   Past Surgical History: Procedure Laterality Date  CHOLECYSTECTOMY      Allergies Allergen Reactions  Iodinated Contrast Media Hives   For future contrast media use, premedicate per ACR and [place] guidelines   Current Outpatient Medications on File Prior to Visit Medication Sig Dispense Refill  famotidine  (PEPCID ) 20 MG tablet Take 20 mg by mouth 2 (two) times daily    naproxen  (NAPROSYN ) 500 MG tablet Take 500 mg by mouth 2 (two) times daily    omeprazole  (PRILOSEC) 20 MG DR capsule Take 20 mg by mouth once daily    No current facility-administered medications on file prior to visit.   Family History Problem Relation Age of Onset  Diabetes Father   Diabetes Maternal Grandmother   Diabetes Maternal Grandfather  Diabetes Paternal Grandmother   Diabetes Paternal Grandfather     Social History  Tobacco Use Smoking Status Never Smokeless Tobacco Never    Social History  Socioeconomic History  Marital status: Married Tobacco Use  Smoking status: Never  Smokeless tobacco: Never  Social Drivers  of Programmer, Systems Insecurity: No Food Insecurity (06/14/2024)  Received from [facility]  Hunger Vital Sign   Within the past 12 months, you worried that your food would run out before you got the money to buy more.: Never true   Within the past 12 months, the food you bought just didn't last and you didn't have money to get more.: Never true Transportation Needs: No Transportation Needs (06/14/2024)  Received from [facility]   In the past 12 months, has lack of transportation kept you from medical appointments or from getting medications?: No   In the past 12 months, has lack of transportation kept you from meetings, work, or from getting things needed for daily living?: No   Objective:   There were no vitals filed for this visit.  There is no height or weight on file to calculate BMI.  Physical Exam Constitutional:      Appearance: Normal appearance.  HENT:     Head: Normocephalic and atraumatic.     Mouth/Throat:     Mouth: Mucous membranes are moist.     Pharynx: Oropharynx is clear.  Eyes:     General: No scleral icterus.    Pupils: Pupils are equal, round, and reactive to light.  Cardiovascular:     Rate and Rhythm: Normal rate and regular rhythm.     Pulses: Normal pulses.     Heart sounds: No murmur heard.    No friction rub. No gallop.  Pulmonary:     Effort: Pulmonary effort is normal. No respiratory distress.     Breath sounds: Normal breath sounds. No stridor.  Abdominal:     General: Abdomen is flat.     Hernia: A hernia is present. Hernia is present in the umbilical area.  Musculoskeletal:        General: No swelling.  Skin:    General: Skin is warm.  Neurological:     General: No focal deficit present.     Mental Status: She is alert and oriented to person, place, and time. Mental status is at baseline.  Psychiatric:        Mood and Affect: Mood normal.        Thought Content: Thought content normal.        Judgment: Judgment  normal.      PE:  Constitutional: No acute distress, conversant, appears states age. Eyes: Anicteric sclerae, moist conjunctiva, no lid lag Lungs: Clear to auscultation bilaterally, normal respiratory effort CV: regular rate and rhythm, no murmurs, no peripheral edema, pedal pulses 2+ GI: Soft, no masses or hepatosplenomegaly, non-tender to palpation Skin: No rashes, palpation reveals normal turgor Psychiatric: appropriate judgment and insight, oriented to person, place, and time   Hernia Size:3x3cm Incarcerated: no Initial Hernia   Assessment and Plan: There are no diagnoses linked to this encounter.  CHRISSY EALEY is a 28 y.o. female   1.  We will proceed to the OR for a lap ventral hernia repair with mesh. 2. All risks and benefits were discussed with the patient, to generally include infection, bleeding, damage to surrounding structures, acute and chronic nerve pain, and recurrence. Alternatives were offered and described.  All questions  were answered and the patient voiced understanding of the procedure and wishes to proceed at this point.       No follow-ups on file.  Lynda Leos, MD, [facility], [place] [facility]vasive Surgery

## 2024-11-04 NOTE — H&P (View-Only) (Signed)
 Chief Complaint: No chief complaint on file.     History of Present Illness: Natasha Koch is a 28 y.o. female who is seen today as an office consultation at the request of Dr. Gretta for evaluation of No chief complaint on file. Natasha Koch   History of Present Illness Natasha Koch is a 28 year old female with longstanding umbilical hernia and diastasis recti who presents for surgical evaluation of worsening hernia symptoms with bowel involvement.  She developed an umbilical hernia after her first pregnancy with progressive worsening. The hernia now protrudes with bending or sitting and requires manual reduction by lying down and applying pressure. Since last Friday she had an episode of severe periumbilical pain with associated nausea, dizziness, hot flashes, and persistent headache. The hernia was only partially reducible at that time, and she required prolonged attempts at reduction.  Concerned for possible strangulation, she went to the ER where the hernia was ultimately reduced. Since then she has had recurrent nausea, dizziness, headache, and activity-limiting periumbilical pain. Sitting or bending at work triggers hernia protrusion and pain that interferes with her job duties.  She previously tried physical therapy because she was afraid of surgery, but her symptoms and bulge worsened, especially after two subsequent pregnancies. She was told she has a wide diastasis, and symptoms have been most severe since her third pregnancy.  She had a laparoscopic cholecystectomy in 2016 that was complicated by postoperative pain thought to be due to retained air and a possible superficial wound infection that required antibiotics. She has had no other abdominal surgeries, takes no daily medications, and was given short courses of acid-suppressing medications in the ER. She has no diabetes, hypertension, or cardiac disease.        Review of Systems: A complete review of systems was obtained from  the patient.  I have reviewed this information and discussed as appropriate with the patient.  See HPI as well for other ROS.  Review of Systems  Constitutional:  Negative for fever.  HENT:  Negative for congestion.   Eyes:  Negative for blurred vision.  Respiratory:  Negative for cough, shortness of breath and wheezing.   Cardiovascular:  Negative for chest pain and palpitations.  Gastrointestinal:  Negative for heartburn.  Genitourinary:  Negative for dysuria.  Musculoskeletal:  Negative for myalgias.  Skin:  Negative for rash.  Neurological:  Negative for dizziness and headaches.  Psychiatric/Behavioral:  Negative for depression and suicidal ideas.   All other systems reviewed and are negative.     Medical History: Past Medical History: Diagnosis Date  Anxiety   Depression   History of hiatal hernia    There is no problem list on file for this patient.   Past Surgical History: Procedure Laterality Date  CHOLECYSTECTOMY      Allergies Allergen Reactions  Iodinated Contrast Media Hives   For future contrast media use, premedicate per ACR and [place] guidelines   Current Outpatient Medications on File Prior to Visit Medication Sig Dispense Refill  famotidine  (PEPCID ) 20 MG tablet Take 20 mg by mouth 2 (two) times daily    naproxen  (NAPROSYN ) 500 MG tablet Take 500 mg by mouth 2 (two) times daily    omeprazole  (PRILOSEC) 20 MG DR capsule Take 20 mg by mouth once daily    No current facility-administered medications on file prior to visit.   Family History Problem Relation Age of Onset  Diabetes Father   Diabetes Maternal Grandmother   Diabetes Maternal Grandfather  Diabetes Paternal Grandmother   Diabetes Paternal Grandfather     Social History  Tobacco Use Smoking Status Never Smokeless Tobacco Never    Social History  Socioeconomic History  Marital status: Married Tobacco Use  Smoking status: Never  Smokeless tobacco: Never  Social Drivers  of Programmer, Systems Insecurity: No Food Insecurity (06/14/2024)  Received from [facility]  Hunger Vital Sign   Within the past 12 months, you worried that your food would run out before you got the money to buy more.: Never true   Within the past 12 months, the food you bought just didn't last and you didn't have money to get more.: Never true Transportation Needs: No Transportation Needs (06/14/2024)  Received from [facility]   In the past 12 months, has lack of transportation kept you from medical appointments or from getting medications?: No   In the past 12 months, has lack of transportation kept you from meetings, work, or from getting things needed for daily living?: No   Objective:   There were no vitals filed for this visit.  There is no height or weight on file to calculate BMI.  Physical Exam Constitutional:      Appearance: Normal appearance.  HENT:     Head: Normocephalic and atraumatic.     Mouth/Throat:     Mouth: Mucous membranes are moist.     Pharynx: Oropharynx is clear.  Eyes:     General: No scleral icterus.    Pupils: Pupils are equal, round, and reactive to light.  Cardiovascular:     Rate and Rhythm: Normal rate and regular rhythm.     Pulses: Normal pulses.     Heart sounds: No murmur heard.    No friction rub. No gallop.  Pulmonary:     Effort: Pulmonary effort is normal. No respiratory distress.     Breath sounds: Normal breath sounds. No stridor.  Abdominal:     General: Abdomen is flat.     Hernia: A hernia is present. Hernia is present in the umbilical area.  Musculoskeletal:        General: No swelling.  Skin:    General: Skin is warm.  Neurological:     General: No focal deficit present.     Mental Status: She is alert and oriented to person, place, and time. Mental status is at baseline.  Psychiatric:        Mood and Affect: Mood normal.        Thought Content: Thought content normal.        Judgment: Judgment  normal.      PE:  Constitutional: No acute distress, conversant, appears states age. Eyes: Anicteric sclerae, moist conjunctiva, no lid lag Lungs: Clear to auscultation bilaterally, normal respiratory effort CV: regular rate and rhythm, no murmurs, no peripheral edema, pedal pulses 2+ GI: Soft, no masses or hepatosplenomegaly, non-tender to palpation Skin: No rashes, palpation reveals normal turgor Psychiatric: appropriate judgment and insight, oriented to person, place, and time   Hernia Size:3x3cm Incarcerated: no Initial Hernia   Assessment and Plan: There are no diagnoses linked to this encounter.  CHRISSY EALEY is a 28 y.o. female   1.  We will proceed to the OR for a lap ventral hernia repair with mesh. 2. All risks and benefits were discussed with the patient, to generally include infection, bleeding, damage to surrounding structures, acute and chronic nerve pain, and recurrence. Alternatives were offered and described.  All questions  were answered and the patient voiced understanding of the procedure and wishes to proceed at this point.       No follow-ups on file.  Lynda Leos, MD, [facility], [place] [facility]

## 2024-11-07 ENCOUNTER — Encounter (HOSPITAL_COMMUNITY): Payer: Self-pay | Admitting: General Surgery

## 2024-11-07 ENCOUNTER — Other Ambulatory Visit: Payer: Self-pay

## 2024-11-07 NOTE — Progress Notes (Signed)
 PCP - none Cardiologist - none  Chest x-ray - N/A EKG - N/A Stress Test - N/A ECHO - N/A Cardiac Cath - n/a  ICD Pacemaker/Loop - n/a  Sleep Study -  n/a  Diabetes - n/a  Aspirin & Blood Thinner Instructions:  n/a  ERAS - clear liquids til 6 AM DOS.  Anesthesia review: no  STOP now taking any Aspirin (unless otherwise instructed by your surgeon), Aleve , Naproxen , Ibuprofen , Motrin , Advil , Goody's, BC's, all herbal medications, fish oil, and all vitamins.   Coronavirus Screening Do you have any of the following symptoms:  Cough yes/no: No Fever (>100.78F)  yes/no: No Runny nose yes/no: No Sore throat yes/no: No Difficulty breathing/shortness of breath  yes/no: No  Have you traveled in the last 14 days and where? yes/no: No  Patient verbalized understanding of instructions that were given via phone.

## 2024-11-10 ENCOUNTER — Other Ambulatory Visit: Payer: Self-pay

## 2024-11-10 ENCOUNTER — Ambulatory Visit (HOSPITAL_COMMUNITY)
Admission: RE | Admit: 2024-11-10 | Discharge: 2024-11-10 | Disposition: A | Discharge status: Home / Self Care | Attending: General Surgery | Admitting: General Surgery

## 2024-11-10 ENCOUNTER — Encounter (HOSPITAL_COMMUNITY): Admission: RE | Disposition: A | Payer: Self-pay | Source: Home / Self Care | Attending: General Surgery

## 2024-11-10 ENCOUNTER — Ambulatory Visit (HOSPITAL_COMMUNITY): Admitting: Anesthesiology

## 2024-11-10 DIAGNOSIS — Z9049 Acquired absence of other specified parts of digestive tract: Secondary | ICD-10-CM | POA: Insufficient documentation

## 2024-11-10 DIAGNOSIS — K219 Gastro-esophageal reflux disease without esophagitis: Secondary | ICD-10-CM | POA: Insufficient documentation

## 2024-11-10 DIAGNOSIS — K436 Other and unspecified ventral hernia with obstruction, without gangrene: Secondary | ICD-10-CM | POA: Diagnosis present

## 2024-11-10 DIAGNOSIS — M6208 Separation of muscle (nontraumatic), other site: Secondary | ICD-10-CM | POA: Insufficient documentation

## 2024-11-10 DIAGNOSIS — K449 Diaphragmatic hernia without obstruction or gangrene: Secondary | ICD-10-CM | POA: Diagnosis not present

## 2024-11-10 HISTORY — PX: VENTRAL HERNIA REPAIR: SHX424

## 2024-11-10 LAB — POCT PREGNANCY, URINE: Preg Test, Ur: NEGATIVE

## 2024-11-10 SURGERY — REPAIR, HERNIA, VENTRAL, LAPAROSCOPIC
Anesthesia: General

## 2024-11-10 MED ORDER — OXYCODONE-ACETAMINOPHEN 5-325 MG PO TABS: 1.0000 | ORAL_TABLET | ORAL | 0 refills | Status: AC | PRN

## 2024-11-10 MED ORDER — BUPIVACAINE HCL 0.25 % IJ SOLN
INTRAMUSCULAR | Status: DC | PRN
  Administered 2024-11-10: 12 mL

## 2024-11-10 MED ORDER — ROCURONIUM BROMIDE 10 MG/ML (PF) SYRINGE
PREFILLED_SYRINGE | INTRAVENOUS | Status: AC
  Filled 2024-11-10: qty 10

## 2024-11-10 MED ORDER — ACETAMINOPHEN 500 MG PO TABS: 1000.0000 mg | ORAL_TABLET | ORAL | Status: AC

## 2024-11-10 MED ORDER — ORAL CARE MOUTH RINSE: 15.0000 mL | Freq: Once | OROMUCOSAL | Status: AC

## 2024-11-10 MED ORDER — LIDOCAINE 2% (20 MG/ML) 5 ML SYRINGE
INTRAMUSCULAR | Status: DC | PRN
  Administered 2024-11-10: 100 mg via INTRAVENOUS

## 2024-11-10 MED ORDER — DEXAMETHASONE SOD PHOSPHATE PF 10 MG/ML IJ SOLN
INTRAMUSCULAR | Status: DC | PRN
  Administered 2024-11-10: 10 mg via INTRAVENOUS

## 2024-11-10 MED ORDER — OXYCODONE HCL 5 MG PO TABS
5.0000 mg | ORAL_TABLET | Freq: Once | ORAL | Status: AC | PRN
  Administered 2024-11-10: 5 mg via ORAL

## 2024-11-10 MED ORDER — DROPERIDOL 2.5 MG/ML IJ SOLN
0.6250 mg | Freq: Once | INTRAMUSCULAR | Status: AC | PRN
  Administered 2024-11-10: 0.625 mg via INTRAVENOUS

## 2024-11-10 MED ORDER — ACETAMINOPHEN 10 MG/ML IV SOLN: 1000.0000 mg | Freq: Once | INTRAVENOUS | Status: DC | PRN

## 2024-11-10 MED ORDER — DEXMEDETOMIDINE HCL IN NACL 80 MCG/20ML IV SOLN
INTRAVENOUS | Status: AC
  Filled 2024-11-10: qty 20

## 2024-11-10 MED ORDER — CHLORHEXIDINE GLUCONATE CLOTH 2 % EX PADS: 6.0000 | MEDICATED_PAD | Freq: Once | CUTANEOUS | Status: DC

## 2024-11-10 MED ORDER — LACTATED RINGERS IV SOLN: INTRAVENOUS | Status: DC

## 2024-11-10 MED ORDER — ONDANSETRON HCL 4 MG/2ML IJ SOLN
INTRAMUSCULAR | Status: AC
  Filled 2024-11-10: qty 2

## 2024-11-10 MED ORDER — FENTANYL CITRATE (PF) 250 MCG/5ML IJ SOLN
INTRAMUSCULAR | Status: DC | PRN
  Administered 2024-11-10 (×2): 50 ug via INTRAVENOUS

## 2024-11-10 MED ORDER — CHLORHEXIDINE GLUCONATE 0.12 % MT SOLN
OROMUCOSAL | Status: AC
  Administered 2024-11-10: 15 mL via OROMUCOSAL
  Filled 2024-11-10: qty 15

## 2024-11-10 MED ORDER — BUPIVACAINE HCL (PF) 0.25 % IJ SOLN
INTRAMUSCULAR | Status: AC
  Filled 2024-11-10: qty 30

## 2024-11-10 MED ORDER — FENTANYL CITRATE (PF) 100 MCG/2ML IJ SOLN
25.0000 ug | INTRAMUSCULAR | Status: DC | PRN
  Administered 2024-11-10 (×2): 50 ug via INTRAVENOUS

## 2024-11-10 MED ORDER — SUGAMMADEX SODIUM 200 MG/2ML IV SOLN
INTRAVENOUS | Status: DC | PRN
  Administered 2024-11-10: 200 mg via INTRAVENOUS

## 2024-11-10 MED ORDER — LIDOCAINE 2% (20 MG/ML) 5 ML SYRINGE
INTRAMUSCULAR | Status: AC
  Filled 2024-11-10: qty 5

## 2024-11-10 MED ORDER — ONDANSETRON HCL 4 MG/2ML IJ SOLN
INTRAMUSCULAR | Status: DC | PRN
  Administered 2024-11-10: 4 mg via INTRAVENOUS

## 2024-11-10 MED ORDER — ACETAMINOPHEN 500 MG PO TABS
ORAL_TABLET | ORAL | Status: AC
  Administered 2024-11-10: 1000 mg via ORAL
  Filled 2024-11-10: qty 2

## 2024-11-10 MED ORDER — PROPOFOL 10 MG/ML IV BOLUS
INTRAVENOUS | Status: AC
  Filled 2024-11-10: qty 20

## 2024-11-10 MED ORDER — KETOROLAC TROMETHAMINE 30 MG/ML IJ SOLN
INTRAMUSCULAR | Status: AC
  Filled 2024-11-10: qty 1

## 2024-11-10 MED ORDER — FENTANYL CITRATE (PF) 100 MCG/2ML IJ SOLN
INTRAMUSCULAR | Status: AC
  Filled 2024-11-10: qty 2

## 2024-11-10 MED ORDER — DEXMEDETOMIDINE HCL IN NACL 80 MCG/20ML IV SOLN
INTRAVENOUS | Status: DC | PRN
  Administered 2024-11-10: 8 ug via INTRAVENOUS

## 2024-11-10 MED ORDER — MIDAZOLAM HCL 2 MG/2ML IJ SOLN
INTRAMUSCULAR | Status: AC
  Filled 2024-11-10: qty 2

## 2024-11-10 MED ORDER — CEFAZOLIN SODIUM-DEXTROSE 2-4 GM/100ML-% IV SOLN
INTRAVENOUS | Status: AC
  Filled 2024-11-10: qty 100

## 2024-11-10 MED ORDER — OXYCODONE HCL 5 MG/5ML PO SOLN: 5.0000 mg | Freq: Once | ORAL | Status: AC | PRN

## 2024-11-10 MED ORDER — CHLORHEXIDINE GLUCONATE 0.12 % MT SOLN: 15.0000 mL | Freq: Once | OROMUCOSAL | Status: AC

## 2024-11-10 MED ORDER — MIDAZOLAM HCL (PF) 2 MG/2ML IJ SOLN
INTRAMUSCULAR | Status: DC | PRN
  Administered 2024-11-10: 2 mg via INTRAVENOUS

## 2024-11-10 MED ORDER — PROPOFOL 10 MG/ML IV BOLUS
INTRAVENOUS | Status: DC | PRN
  Administered 2024-11-10: 140 mg via INTRAVENOUS

## 2024-11-10 MED ORDER — DROPERIDOL 2.5 MG/ML IJ SOLN
INTRAMUSCULAR | Status: AC
  Filled 2024-11-10: qty 2

## 2024-11-10 MED ORDER — ROCURONIUM BROMIDE 100 MG/10ML IV SOLN
INTRAVENOUS | Status: DC | PRN
  Administered 2024-11-10: 70 mg via INTRAVENOUS

## 2024-11-10 MED ORDER — OXYCODONE HCL 5 MG PO TABS
ORAL_TABLET | ORAL | Status: AC
  Filled 2024-11-10: qty 1

## 2024-11-10 MED ORDER — 0.9 % SODIUM CHLORIDE (POUR BTL) OPTIME
TOPICAL | Status: DC | PRN
  Administered 2024-11-10: 1000 mL

## 2024-11-10 MED ORDER — CEFAZOLIN SODIUM-DEXTROSE 2-4 GM/100ML-% IV SOLN
2.0000 g | INTRAVENOUS | Status: AC
  Administered 2024-11-10: 2 g via INTRAVENOUS

## 2024-11-10 MED ORDER — SUGAMMADEX SODIUM 200 MG/2ML IV SOLN
INTRAVENOUS | Status: AC
  Filled 2024-11-10: qty 2

## 2024-11-10 SURGICAL SUPPLY — 39 items
BAG COUNTER SPONGE SURGICOUNT (BAG) ×1 IMPLANT
CANISTER SUCTION 3000ML PPV (SUCTIONS) IMPLANT
CHLORAPREP W/TINT 26 (MISCELLANEOUS) ×1 IMPLANT
CLIP APPLIE 5 13 M/L LIGAMAX5 (MISCELLANEOUS) IMPLANT
COVER SURGICAL LIGHT HANDLE (MISCELLANEOUS) ×1 IMPLANT
DERMABOND ADVANCED .7 DNX12 (GAUZE/BANDAGES/DRESSINGS) ×1 IMPLANT
DEVICE SECURE STRAP 25 ABSORB (INSTRUMENTS) ×1 IMPLANT
DEVICE TROCAR PUNCTURE CLOSURE (ENDOMECHANICALS) ×1 IMPLANT
ELECTRODE REM PT RTRN 9FT ADLT (ELECTROSURGICAL) ×1 IMPLANT
GLOVE BIO SURGEON STRL SZ7.5 (GLOVE) ×2 IMPLANT
GOWN STRL REUS W/ TWL LRG LVL3 (GOWN DISPOSABLE) ×2 IMPLANT
GOWN STRL REUS W/ TWL XL LVL3 (GOWN DISPOSABLE) ×1 IMPLANT
IRRIGATION SUCT STRKRFLW 2 WTP (MISCELLANEOUS) IMPLANT
KIT BASIN OR (CUSTOM PROCEDURE TRAY) ×1 IMPLANT
KIT TURNOVER KIT B (KITS) ×1 IMPLANT
MESH VENTRLGHT ELLIPSE 8X6XMFL (Mesh Specialty) IMPLANT
NDL INSUFFLATION 14GA 120MM (NEEDLE) ×1 IMPLANT
NEEDLE INSUFFLATION 14GA 120MM (NEEDLE) ×1 IMPLANT
PAD ARMBOARD POSITIONER FOAM (MISCELLANEOUS) ×2 IMPLANT
POUCH LAPAROSCOPIC INSTRUMENT (MISCELLANEOUS) ×1 IMPLANT
SCISSORS LAP 5X35 DISP (ENDOMECHANICALS) ×1 IMPLANT
SET TUBE SMOKE EVAC HIGH FLOW (TUBING) ×1 IMPLANT
SHEARS HARMONIC 36 ACE (MISCELLANEOUS) IMPLANT
SLEEVE Z-THREAD 5X100MM (TROCAR) ×1 IMPLANT
SOLN 0.9% NACL POUR BTL 1000ML (IV SOLUTION) ×1 IMPLANT
SOLN STERILE WATER BTL 1000 ML (IV SOLUTION) ×1 IMPLANT
SUT CHROMIC 2 0 SH (SUTURE) ×1 IMPLANT
SUT ETHIBOND 0 MO6 C/R (SUTURE) IMPLANT
SUT MNCRL AB 4-0 PS2 18 (SUTURE) ×1 IMPLANT
SUT NOVA 1 T20/GS 25DT (SUTURE) IMPLANT
SUT PROLENE 2 0 KS (SUTURE) IMPLANT
SUT VICRYL 0 UR6 27IN ABS (SUTURE) IMPLANT
TOWEL GREEN STERILE (TOWEL DISPOSABLE) ×1 IMPLANT
TOWEL GREEN STERILE FF (TOWEL DISPOSABLE) ×1 IMPLANT
TRAY FOLEY W/BAG SLVR 16FR ST (SET/KITS/TRAYS/PACK) IMPLANT
TRAY LAPAROSCOPIC MC (CUSTOM PROCEDURE TRAY) ×1 IMPLANT
TROCAR 11X100 Z THREAD (TROCAR) IMPLANT
TROCAR Z-THREAD OPTICAL 5X100M (TROCAR) ×1 IMPLANT
WARMER LAPAROSCOPE (MISCELLANEOUS) ×1 IMPLANT

## 2024-11-10 NOTE — Transfer of Care (Signed)
 Immediate Anesthesia Transfer of Care Note  Patient: Natasha Koch  Procedure(s) Performed: REPAIR, HERNIA, VENTRAL, LAPAROSCOPIC WITH MESH  Patient Location: PACU  Anesthesia Type:General  Level of Consciousness: awake  Airway & Oxygen Therapy: Patient Spontanous Breathing  Post-op Assessment: Report given to RN and Post -op Vital signs reviewed and stable  Post vital signs: Reviewed and stable  Last Vitals:  Vitals Value Taken Time  BP 112/58 11/10/24 09:49  Temp 36.4 C 11/10/24 09:49  Pulse 76 11/10/24 09:50  Resp 14 11/10/24 09:50  SpO2 99 % 11/10/24 09:50  Vitals shown include unfiled device data.  Last Pain:  Vitals:   11/10/24 0806  TempSrc:   PainSc: 0-No pain      Patients Stated Pain Goal: 0 (11/10/24 0804)  Complications: No notable events documented.

## 2024-11-10 NOTE — Op Note (Signed)
 11/10/2024  9:35 AM  PATIENT:  Natasha Koch  28 y.o. female  PRE-OPERATIVE DIAGNOSIS:  ventral hernia incarcerated, 2 CM  POST-OPERATIVE DIAGNOSIS:  ventral hernia incarcerated 2 CM  PROCEDURE:  Procedures with comments: REPAIR, HERNIA, VENTRAL, LAPAROSCOPIC WITH MESH (N/A) - LAPAROSCOPIC VENTRAL HERNIA REPAIR WITH MESH  SURGEON:  Surgeons and Role:    * Rubin Calamity, MD - Primary  ASSISTANTS: none   ANESTHESIA:   local and general  EBL:  minimal   BLOOD ADMINISTERED:none  DRAINS: none   LOCAL MEDICATIONS USED:  BUPIVICAINE   SPECIMEN:  No Specimen  DISPOSITION OF SPECIMEN:  N/A  COUNTS:  YES  TOURNIQUET:  * No tourniquets in log *  DICTATION: .Dragon Dictation  Findings: 2cm incarcerated VH  Details of the procedure:  After the patient was consented patient was taken back to the operating room patient was then placed in supine position bilateral SCDs in place.  The patient was prepped and draped in the usual sterile fashion. After antibiotics were confirmed a timeout was called and all facts were verified. The Veress needle technique was used to insuflate the abdomen at Palmer's point. The abdomen was insufflated to 14 mm mercury. Subsequently a 5 mm trocar was placed a camera inserted there was no injury to any intra-abdominal organs.    There was seen to be an incarcerated  2cm ventral hernia.  A second camera port was in placed into the left lower quadrant.   At this the Falicform ligament was taken down with Bovie cautery maintaining hemostasis.   I proceeded to reduce the hernia contents.  The hernia sac was dissected out of the hernia and disposed.  The fascia at the hernia was reapproximated using a #0 Ethibonds x 3.  Once the hernia was cleared away, a Bard Ventralight 15x20cm  mesh was inserted into the abdomen.  The mesh was secured circumferentially with am Securestrap tacker in a double crown fashion.   The omentum was brought over the area of the  mesh. The fascia at the left lower quadrant port site was reapproximated with a 0 vicryl and an endoclose device.  The pneumoperitoneum was evacuated & all trocars  were removed. The skin was reapproximated with 4-0  Monocryl sutures in a subcuticular fashion. The skin was dressed with Dermabond.  The patient was taken to the recovery room in stable condition.  Type of repair -primary suture & mesh  Mesh overlap - 7cm  Placement of mesh -  beneath fascia and into peritoneal cavity  Size<3cm  PLAN OF CARE: Discharge to home after PACU  PATIENT DISPOSITION:  PACU - hemodynamically stable.   Delay start of Pharmacological VTE agent (>24hrs) due to surgical blood loss or risk of bleeding: not applicable

## 2024-11-10 NOTE — Anesthesia Procedure Notes (Signed)
 Procedure Name: Intubation Date/Time: 11/10/2024 8:58 AM  Performed by: Harriett Azar A, CRNAPre-anesthesia Checklist: Patient identified, Emergency Drugs available, Suction available and Patient being monitored Patient Re-evaluated:Patient Re-evaluated prior to induction Oxygen Delivery Method: Circle System Utilized Preoxygenation: Pre-oxygenation with 100% oxygen Induction Type: IV induction Ventilation: Mask ventilation without difficulty Laryngoscope Size: Mac and 3 Grade View: Grade I Tube type: Oral Tube size: 7.5 mm Number of attempts: 1 Airway Equipment and Method: Stylet and Oral airway Placement Confirmation: ETT inserted through vocal cords under direct vision, positive ETCO2 and breath sounds checked- equal and bilateral Secured at: 22 cm Tube secured with: Tape Dental Injury: Teeth and Oropharynx as per pre-operative assessment

## 2024-11-10 NOTE — Discharge Instructions (Signed)

## 2024-11-10 NOTE — Interval H&P Note (Signed)
 History and Physical Interval Note:  11/10/2024 8:20 AM  Natasha Koch  has presented today for surgery, with the diagnosis of ventral hernia incarcerated.  The various methods of treatment have been discussed with the patient and family. After consideration of risks, benefits and other options for treatment, the patient has consented to  Procedures with comments: REPAIR, HERNIA, VENTRAL, LAPAROSCOPIC (N/A) - LAPAROSCOPIC VENTRAL HERNIA REPAIR WITH MESH as a surgical intervention.  The patient's history has been reviewed, patient examined, no change in status, stable for surgery.  I have reviewed the patient's chart and labs.  Questions were answered to the patient's satisfaction.     Jemimah Cressy

## 2024-11-10 NOTE — Anesthesia Preprocedure Evaluation (Signed)
 "                                  Anesthesia Evaluation  Patient identified by MRN, date of birth, ID band Patient awake    Reviewed: Allergy & Precautions, NPO status , Patient's Chart, lab work & pertinent test results  History of Anesthesia Complications Negative for: history of anesthetic complications  Airway Mallampati: II  TM Distance: >3 FB Neck ROM: Full    Dental  (+) Teeth Intact, Dental Advisory Given   Pulmonary neg shortness of breath, neg sleep apnea, neg COPD, neg recent URI   breath sounds clear to auscultation       Cardiovascular negative cardio ROS  Rhythm:Regular     Neuro/Psych  PSYCHIATRIC DISORDERS Anxiety     negative neurological ROS     GI/Hepatic hiatal hernia,GERD  ,,  Endo/Other    Renal/GU      Musculoskeletal negative musculoskeletal ROS (+)    Abdominal   Peds  Hematology negative hematology ROS (+) Lab Results      Component                Value               Date                      WBC                      10.4                10/31/2024                HGB                      14.4                10/31/2024                HCT                      41.8                10/31/2024                MCV                      84.1                10/31/2024                PLT                      312                 10/31/2024              Anesthesia Other Findings   Reproductive/Obstetrics Lab Results      Component                Value               Date                      PREGTESTUR               NEGATIVE  10/31/2024                HCG                      <5.0                10/04/2022                                         Anesthesia Physical Anesthesia Plan  ASA: 2  Anesthesia Plan: General   Post-op Pain Management: Ofirmev  IV (intra-op)*, Toradol  IV (intra-op)* and Precedex    Induction: Intravenous  PONV Risk Score and Plan: 4 or greater and Ondansetron ,  Dexamethasone  and Midazolam   Airway Management Planned: Oral ETT  Additional Equipment: None  Intra-op Plan:   Post-operative Plan: Extubation in OR  Informed Consent: I have reviewed the patients History and Physical, chart, labs and discussed the procedure including the risks, benefits and alternatives for the proposed anesthesia with the patient or authorized representative who has indicated his/her understanding and acceptance.     Dental advisory given  Plan Discussed with: CRNA  Anesthesia Plan Comments:         Anesthesia Quick Evaluation  "

## 2024-11-11 ENCOUNTER — Encounter (HOSPITAL_COMMUNITY): Payer: Self-pay | Admitting: General Surgery

## 2024-11-12 NOTE — Anesthesia Postprocedure Evaluation (Signed)
"   Anesthesia Post Note  Patient: Natasha Koch  Procedure(s) Performed: REPAIR, HERNIA, VENTRAL, LAPAROSCOPIC WITH MESH     Patient location during evaluation: PACU Anesthesia Type: General Level of consciousness: awake and alert Pain management: pain level controlled Vital Signs Assessment: post-procedure vital signs reviewed and stable Respiratory status: spontaneous breathing, nonlabored ventilation and respiratory function stable Cardiovascular status: blood pressure returned to baseline and stable Postop Assessment: no apparent nausea or vomiting Anesthetic complications: no   No notable events documented.                  Tyshia Fenter      "
